# Patient Record
Sex: Female | Born: 2000 | Race: White | Hispanic: No | Smoking: Never smoker
Health system: Southern US, Community
[De-identification: ages and names within clinical notes are randomized; demographics above are authoritative.]

## PROBLEM LIST (undated history)

## (undated) DIAGNOSIS — F419 Anxiety disorder, unspecified: Secondary | ICD-10-CM

## (undated) DIAGNOSIS — E559 Vitamin D deficiency, unspecified: Secondary | ICD-10-CM

## (undated) DIAGNOSIS — F32A Depression, unspecified: Secondary | ICD-10-CM

## (undated) DIAGNOSIS — N390 Urinary tract infection, site not specified: Secondary | ICD-10-CM

## (undated) DIAGNOSIS — J302 Other seasonal allergic rhinitis: Secondary | ICD-10-CM

## (undated) HISTORY — DX: Urinary tract infection, site not specified: N39.0

## (undated) HISTORY — DX: Anxiety disorder, unspecified: F41.9

## (undated) HISTORY — DX: Vitamin D deficiency, unspecified: E55.9

## (undated) HISTORY — DX: Other seasonal allergic rhinitis: J30.2

## (undated) HISTORY — DX: Depression, unspecified: F32.A

## (undated) HISTORY — PX: NO PAST SURGERIES: SHX2092

---
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
## 2020-08-10 ENCOUNTER — Other Ambulatory Visit: Payer: Self-pay

## 2020-08-10 ENCOUNTER — Ambulatory Visit
Admission: EM | Admit: 2020-08-10 | Discharge: 2020-08-10 | Disposition: A | Discharge status: Home / Self Care | Payer: Self-pay

## 2020-08-10 NOTE — ED Notes (Signed)
Patient access, nicole, cma reports patient does not have insurance and wanted to be seen today, but billing processed tomorrow.  When told no, patient left

## 2020-08-11 ENCOUNTER — Encounter: Payer: Self-pay | Admitting: Internal Medicine

## 2020-08-11 ENCOUNTER — Ambulatory Visit: Payer: BC Managed Care – PPO | Admitting: Internal Medicine

## 2020-08-11 ENCOUNTER — Other Ambulatory Visit (HOSPITAL_COMMUNITY)
Admission: RE | Admit: 2020-08-11 | Discharge: 2020-08-11 | Disposition: A | Discharge status: Home / Self Care | Payer: BC Managed Care – PPO | Source: Ambulatory Visit | Attending: Internal Medicine | Admitting: Internal Medicine

## 2020-08-11 VITALS — BP 118/74 | HR 110 | Temp 98.2°F | Ht 64.0 in | Wt 118.0 lb

## 2020-08-11 DIAGNOSIS — E559 Vitamin D deficiency, unspecified: Secondary | ICD-10-CM

## 2020-08-11 DIAGNOSIS — N760 Acute vaginitis: Secondary | ICD-10-CM | POA: Diagnosis not present

## 2020-08-11 DIAGNOSIS — Z0000 Encounter for general adult medical examination without abnormal findings: Secondary | ICD-10-CM

## 2020-08-11 DIAGNOSIS — N3000 Acute cystitis without hematuria: Secondary | ICD-10-CM

## 2020-08-11 DIAGNOSIS — R198 Other specified symptoms and signs involving the digestive system and abdomen: Secondary | ICD-10-CM

## 2020-08-11 DIAGNOSIS — F419 Anxiety disorder, unspecified: Secondary | ICD-10-CM

## 2020-08-11 DIAGNOSIS — R102 Pelvic and perineal pain: Secondary | ICD-10-CM | POA: Diagnosis not present

## 2020-08-11 DIAGNOSIS — R140 Abdominal distension (gaseous): Secondary | ICD-10-CM

## 2020-08-11 DIAGNOSIS — R1084 Generalized abdominal pain: Secondary | ICD-10-CM

## 2020-08-11 LAB — CBC WITH DIFFERENTIAL/PLATELET
Basophils Absolute: 0 10*3/uL (ref 0.0–0.1)
Basophils Relative: 0.8 % (ref 0.0–3.0)
Eosinophils Absolute: 0.2 10*3/uL (ref 0.0–0.7)
Eosinophils Relative: 2.8 % (ref 0.0–5.0)
HCT: 40 % (ref 36.0–46.0)
Hemoglobin: 13.6 g/dL (ref 12.0–15.0)
Lymphocytes Relative: 16.7 % (ref 12.0–46.0)
Lymphs Abs: 1 10*3/uL (ref 0.7–4.0)
MCHC: 34.1 g/dL (ref 30.0–36.0)
MCV: 85.9 fl (ref 78.0–100.0)
Monocytes Absolute: 0.3 10*3/uL (ref 0.1–1.0)
Monocytes Relative: 5.1 % (ref 3.0–12.0)
Neutro Abs: 4.3 10*3/uL (ref 1.4–7.7)
Neutrophils Relative %: 74.6 % (ref 43.0–77.0)
Platelets: 233 10*3/uL (ref 150.0–400.0)
RBC: 4.65 Mil/uL (ref 3.87–5.11)
RDW: 13.3 % (ref 11.5–14.6)
WBC: 5.8 10*3/uL (ref 4.5–10.5)

## 2020-08-11 LAB — COMPREHENSIVE METABOLIC PANEL
ALT: 22 U/L (ref 0–35)
AST: 24 U/L (ref 0–37)
Albumin: 4.5 g/dL (ref 3.5–5.2)
Alkaline Phosphatase: 44 U/L (ref 39–117)
BUN: 14 mg/dL (ref 6–23)
CO2: 27 mEq/L (ref 19–32)
Calcium: 9.7 mg/dL (ref 8.4–10.5)
Chloride: 105 mEq/L (ref 96–112)
Creatinine, Ser: 0.93 mg/dL (ref 0.40–1.20)
GFR: 88.69 mL/min (ref >60.00)
Glucose, Bld: 85 mg/dL (ref 70–99)
Potassium: 4.3 mEq/L (ref 3.5–5.1)
Sodium: 141 mEq/L (ref 135–145)
Total Bilirubin: 0.7 mg/dL (ref 0.2–1.2)
Total Protein: 7.2 g/dL (ref 6.0–8.3)

## 2020-08-11 LAB — VITAMIN D 25 HYDROXY (VIT D DEFICIENCY, FRACTURES): VITD: 28.92 ng/mL — ABNORMAL LOW (ref 30.00–100.00)

## 2020-08-11 LAB — TSH: TSH: 1.6 u[IU]/mL (ref 0.35–5.50)

## 2020-08-11 MED ORDER — FLUCONAZOLE 150 MG PO TABS: 150.0000 mg | ORAL_TABLET | Freq: Once | ORAL | 0 refills | Status: DC

## 2020-08-11 NOTE — Patient Instructions (Addendum)
Consider seeing GI in the future -let us know  -[facility]  -[facility]    Call ob/gyn if you continue to have pelvic pain   [facility]  [place] [address] [phone]   [facility]  [address]  [place] [address]  [phone]  Nonspecific Chest Pain, Adult Chest pain can be caused by many different conditions. It can be caused by a condition that is life-threatening and requires treatment right away. It can also be caused by something that is not life-threatening. If you have chest pain, it can be hard to know the difference, so it is important to get help right away to make sure that you do not have a serious condition. Some life-threatening causes of chest pain include:  Heart attack.  A tear in the body's main blood vessel (aortic dissection).  Inflammation around your heart (pericarditis).  A problem in the lungs, such as a blood clot (pulmonary embolism) or a collapsed lung (pneumothorax). Some non life-threatening causes of chest pain include:  Heartburn.  Anxiety or stress.  Damage to the bones, muscles, and cartilage that make up your chest wall.  Pneumonia or bronchitis.  Shingles infection (varicella-zoster virus). Chest pain can feel like:  Pain or discomfort on the surface of your chest or deep in your chest.  Crushing, pressure, aching, or squeezing pain.  Burning or tingling.  Dull or sharp pain that is worse when you move, cough, or take a deep breath.  Pain or discomfort that is also felt in your back, neck, jaw, shoulder, or arm, or pain that spreads to any of these areas. Your chest pain may come and go. It may also be constant. Your health care provider will do lab tests and other studies to find the cause of your pain. Treatment will depend on the cause of your chest pain. Follow these  instructions at home: Medicines  Take over-the-counter and prescription medicines only as told by your health care provider.  If you were prescribed an antibiotic, take it as told by your health care provider. Do not stop taking the antibiotic even if you start to feel better. Lifestyle  Rest as directed by your health care provider.  Do not use any products that contain nicotine or tobacco, such as cigarettes and e-cigarettes. If you need help quitting, ask your health care provider.  Do not drink alcohol.  Make healthy lifestyle choices as recommended. These may include: ? Getting regular exercise. Ask your health care provider to suggest some activities that are safe for you. ? Eating a heart-healthy diet. This includes plenty of fresh fruits and vegetables, whole grains, low-fat (lean) protein, and low-fat dairy products. A dietitian can help you find healthy eating options. ? Maintaining a healthy weight. ? Managing any other health conditions you have, such as high blood pressure (hypertension) or diabetes. ? Reducing stress, such as with yoga or relaxation techniques.   General instructions  Pay attention to any changes in your symptoms. Tell your health care provider about them or any new symptoms.  Avoid any activities that cause chest pain.  Keep all follow-up visits as told by your health care provider. This is important. This includes visits for any further testing if your chest pain does not go away. Contact a health care provider if:  Your chest pain does not go away.  You feel depressed.  You have a fever. Get help right away if:  Your chest pain gets worse.  You have a cough that gets worse, or you cough up blood.  You have severe pain in your abdomen.  You faint.  You have sudden, unexplained chest discomfort.  You have sudden, unexplained discomfort in your arms, back, neck, or jaw.  You have shortness of breath at any time.  You suddenly start to  sweat, or your skin gets clammy.  You feel nausea or you vomit.  You suddenly feel lightheaded or dizzy.  You have severe weakness, or unexplained weakness or fatigue.  Your heart begins to beat quickly, or it feels like it is skipping beats. These symptoms may represent a serious problem that is an emergency. Do not wait to see if the symptoms will go away. Get medical help right away. Call your local emergency services (911 in the [place] Do not drive yourself to the hospital. Summary  Chest pain can be caused by a condition that is serious and requires urgent treatment. It may also be caused by something that is not life-threatening.  If you have chest pain, it is very important to see your health care provider. Your health care provider may do lab tests and other studies to find the cause of your pain.  Follow your health care provider's instructions on taking medicines, making lifestyle changes, and getting emergency treatment if symptoms become worse.  Keep all follow-up visits as told by your health care provider. This includes visits for any further testing if your chest pain does not go away. This information is not intended to replace advice given to you by your health care provider. Make sure you discuss any questions you have with your health care provider. Document Revised: 10/30/2017 Document Reviewed: 10/30/2017 Elsevier Patient Education  2021 [employer].  Fatigue If you have fatigue, you feel tired all the time and have a lack of energy or a lack of motivation. Fatigue may make it difficult to start or complete tasks because of exhaustion. In general, occasional or mild fatigue is often a normal response to activity or life. However, long-lasting (chronic) or extreme fatigue may be a symptom of a medical condition. Follow these instructions at home: General instructions  Watch your fatigue for any changes.  Go to bed and get up at the same time every day.  Avoid  fatigue by pacing yourself during the day and getting enough sleep at night.  Maintain a healthy weight. Medicines  Take over-the-counter and prescription medicines only as told by your health care provider.  Take a multivitamin, if told by your health care provider.  Do not use herbal or dietary supplements unless they are approved by your health care provider. Activity  Exercise regularly, as told by your health care provider.  Use or practice techniques to help you relax, such as yoga, tai chi, meditation, or massage therapy.   Eating and drinking  Avoid heavy meals in the evening.  Eat a well-balanced diet, which includes lean proteins, whole grains, plenty of fruits and vegetables, and low-fat dairy products.  Avoid consuming too much caffeine.  Avoid the use of alcohol.  Drink enough fluid to keep your urine pale yellow.   Lifestyle  Change situations that cause you stress. Try to keep your work and personal schedule in balance.  Do not use any products that contain nicotine or tobacco, such as cigarettes and e-cigarettes. If you need help quitting, ask your health care provider.  Do not use drugs. Contact a health care provider if:  Your fatigue does not get better.  You have a fever.  You suddenly lose or gain weight.  You have headaches.  You have trouble falling asleep or sleeping through the night.  You feel angry, guilty, anxious, or sad.  You are unable to have a bowel movement (constipation).  Your skin is dry.  You have swelling in your legs or another part of your body. Get help right away if:  You feel confused.  Your vision is blurry.  You feel faint or you pass out.  You have a severe headache.  You have severe pain in your abdomen, your back, or the area between your waist and hips (pelvis).  You have chest pain, shortness of breath, or an irregular or fast heartbeat.  You are unable to urinate, or you urinate less than  normal.  You have abnormal bleeding, such as bleeding from the rectum, vagina, nose, lungs, or nipples.  You vomit blood.  You have thoughts about hurting yourself or others. If you ever feel like you may hurt yourself or others, or have thoughts about taking your own life, get help right away. You can go to your nearest emergency department or call:  Your local emergency services (911 in the [place]  A suicide crisis helpline, such as the [facility] at 628-433-0298. This is open 24 hours a day. Summary  If you have fatigue, you feel tired all the time and have a lack of energy or a lack of motivation.  Fatigue may make it difficult to start or complete tasks because of exhaustion.  Long-lasting (chronic) or extreme fatigue may be a symptom of a medical condition.  Exercise regularly, as told by your health care provider.  Change situations that cause you stress. Try to keep your work and personal schedule in balance. This information is not intended to replace advice given to you by your health care provider. Make sure you discuss any questions you have with your health care provider. Document Revised: 11/18/2018 Document Reviewed: 01/22/2017 Elsevier Patient Education  2021 [place].  Vaginal Yeast Infection, Adult  Vaginal yeast infection is a condition that causes vaginal discharge as well as soreness, swelling, and redness (inflammation) of the vagina. This is a common condition. Some women get this infection frequently. What are the causes? This condition is caused by a change in the normal balance of the yeast (candida) and bacteria that live in the vagina. This change causes an overgrowth of yeast, which causes the inflammation. What increases the risk? The condition is more likely to develop in women who:  Take antibiotic medicines.  Have diabetes.  Take birth control pills.  Are pregnant.  Douche often.  Have a weak body defense  system (immune system).  Have been taking steroid medicines for a long time.  Frequently wear tight clothing. What are the signs or symptoms? Symptoms of this condition include:  White, thick, creamy vaginal discharge.  Swelling, itching, redness, and irritation of the vagina. The lips of the vagina (vulva) may be affected as well.  Pain or a burning feeling while urinating.  Pain during sex. How is this diagnosed? This condition is diagnosed based on:  Your medical history.  A physical exam.  A pelvic exam. Your health care provider will examine a sample of your vaginal discharge under a microscope. Your health care provider may send this sample for testing to confirm the diagnosis. How is  this treated? This condition is treated with medicine. Medicines may be over-the-counter or prescription. You may be told to use one or more of the following:  Medicine that is taken by mouth (orally).  Medicine that is applied as a cream (topically).  Medicine that is inserted directly into the vagina (suppository). Follow these instructions at home: Lifestyle  Do not have sex until your health care provider approves. Tell your sex partner that you have a yeast infection. That person should go to his or her health care provider and ask if they should also be treated.  Do not wear tight clothes, such as pantyhose or tight pants.  Wear breathable cotton underwear. General instructions  Take or apply over-the-counter and prescription medicines only as told by your health care provider.  Eat more yogurt. This may help to keep your yeast infection from returning.  Do not use tampons until your health care provider approves.  Try taking a sitz bath to help with discomfort. This is a warm water bath that is taken while you are sitting down. The water should only come up to your hips and should cover your buttocks. Do this 3-4 times per day or as told by your health care provider.  Do not  douche.  If you have diabetes, keep your blood sugar levels under control.  Keep all follow-up visits as told by your health care provider. This is important.   Contact a health care provider if:  You have a fever.  Your symptoms go away and then return.  Your symptoms do not get better with treatment.  Your symptoms get worse.  You have new symptoms.  You develop blisters in or around your vagina.  You have blood coming from your vagina and it is not your menstrual period.  You develop pain in your abdomen. Summary  Vaginal yeast infection is a condition that causes discharge as well as soreness, swelling, and redness (inflammation) of the vagina.  This condition is treated with medicine. Medicines may be over-the-counter or prescription.  Take or apply over-the-counter and prescription medicines only as told by your health care provider.  Do not douche. Do not have sex or use tampons until your health care provider approves.  Contact a health care provider if your symptoms do not get better with treatment or your symptoms go away and then return. This information is not intended to replace advice given to you by your health care provider. Make sure you discuss any questions you have with your health care provider. Document Revised: 11/27/2018 Document Reviewed: 09/15/2017 Elsevier Patient Education  2021 [place].  Bacterial Vaginosis  Bacterial vaginosis is an infection that occurs when the normal balance of bacteria in the vagina changes. This change is caused by an overgrowth of certain bacteria in the vagina. Bacterial vaginosis is the most common vaginal infection among females aged 90 to 33 years. This condition increases the risk of sexually transmitted infections (STIs). Treatment can help reduce this risk. Treatment is very important for pregnant women because this condition can cause babies to be born early (prematurely) or at a low birth weight. What are the  causes? This condition is caused by an increase in harmful bacteria that are normally present in small amounts in the vagina. However, the exact reason this condition develops is not known. You cannot get bacterial vaginosis from toilet seats, bedding, swimming pools, or contact with objects around you. What increases the risk? The following factors may make you more likely  to develop this condition:  Having a new sexual partner or multiple sexual partners, or having unprotected sex.  Douching.  Having an intrauterine device (IUD).  Smoking.  Abusing drugs and alcohol. This may lead to riskier sexual behavior.  Taking certain antibiotic medicines.  Being pregnant. What are the signs or symptoms? Some women with this condition have no symptoms. Symptoms may include:  Remmers or white vaginal discharge. The discharge can be watery or foamy.  A fish-like odor with discharge, especially after sex or during menstruation.  Itching in and around the vagina.  Burning or pain with urination. How is this diagnosed? This condition is diagnosed based on:  Your medical history.  A physical exam of the vagina.  Checking a sample of vaginal fluid for harmful bacteria or abnormal cells. How is this treated? This condition is treated with antibiotic medicines. These may be given as a pill, a vaginal cream, or a medicine that is put into the vagina (suppository). If the condition comes back after treatment, a second round of antibiotics may be needed. Follow these instructions at home: Medicines  Take or apply over-the-counter and prescription medicines only as told by your health care provider.  Take or apply your antibiotic medicine as told by your health care provider. Do not stop using the antibiotic even if you start to feel better. General instructions  If you have a female sexual partner, tell her that you have a vaginal infection. She should follow up with her health care provider.  If you have a female sexual partner, he does not need treatment.  Avoid sexual activity until you finish treatment.  Drink enough fluid to keep your urine pale yellow.  Keep the area around your vagina and rectum clean. ? Wash the area daily with warm water. ? Wipe yourself from front to back after using the toilet.  If you are breastfeeding, talk to your health care provider about continuing breastfeeding during treatment.  Keep all follow-up visits. This is important. How is this prevented? Self-care  Do not douche.  Wash the outside of your vagina with warm water only.  Wear cotton or cotton-lined underwear.  Avoid wearing tight pants and pantyhose, especially during the summer. Safe sex  Use protection when having sex. This includes: ? Using condoms. ? Using dental dams. This is a thin layer of a material made of latex or polyurethane that protects the mouth during oral sex.  Limit the number of sexual partners. To help prevent bacterial vaginosis, it is best to have sex with just one partner (monogamous relationship).  Make sure you and your sexual partner are tested for STIs. Drugs and alcohol  Do not use any products that contain nicotine or tobacco. These products include cigarettes, chewing tobacco, and vaping devices, such as e-cigarettes. If you need help quitting, ask your health care provider.  Do not use drugs.  Do not drink alcohol if: ? Your health care provider tells you not to do this. ? You are pregnant, may be pregnant, or are planning to become pregnant.  If you drink alcohol: ? Limit how much you have to 0-1 drink a day. ? Be aware of how much alcohol is in your drink. In the [place] one drink equals one 12 oz bottle of beer (355 mL), one 5 oz glass of wine (148 mL), or one 1 oz glass of hard liquor (44 mL). Where to find more information  Centers for Disease Control and Prevention: http://www.wolf.info/  American Sexual Health  Association (ASHA):  www.ashastd.org  U.S. Department of Health and Financial controller, Office on Women's Health: [place].tn Contact a health care provider if:  Your symptoms do not improve, even after treatment.  You have more discharge or pain when urinating.  You have a fever or chills.  You have pain in your abdomen or pelvis.  You have pain during sex.  You have vaginal bleeding between menstrual periods. Summary  Bacterial vaginosis is a vaginal infection that occurs when the normal balance of bacteria in the vagina changes. It results from an overgrowth of certain bacteria.  This condition increases the risk of sexually transmitted infections (STIs). Getting treated can help reduce this risk.  Treatment is very important for pregnant women because this condition can cause babies to be born early (prematurely) or at low birth weight.  This condition is treated with antibiotic medicines. These may be given as a pill, a vaginal cream, or a medicine that is put into the vagina (suppository). This information is not intended to replace advice given to you by your health care provider. Make sure you discuss any questions you have with your health care provider. Document Revised: 10/28/2019 Document Reviewed: 10/28/2019 Elsevier Patient Education  [place].

## 2020-08-11 NOTE — Progress Notes (Addendum)
Chief Complaint  Patient presents with  . Urinary Tract Infection  . Vaginitis   F/u  1. C/o lower ab pain and discomfort since Monday 5/10 stomach pain but h/o GI issues in the past Sunday or Monday used a different soap from dove coconut to cucumber and then sx's of itching started and when placed OCP patch Sunday had abdominal pain and gym Monday with tight underwear and sweating. She had rectal itching then in vaginal tried vagisil Monday  She is sexually active   Review of Systems  Constitutional: Negative for weight loss.  HENT: Negative for hearing loss.   Eyes: Negative for blurred vision.  Respiratory: Negative for shortness of breath.   Cardiovascular: Negative for chest pain.  Gastrointestinal: Positive for abdominal pain.  Genitourinary: Negative for dysuria.       +female pelvic pain   Skin: Negative for rash.  Psychiatric/Behavioral: The patient is nervous/anxious.    Past Medical History:  Diagnosis Date  . Seasonal allergies    No past surgical history on file. Family History  Problem Relation Age of Onset  . Hypertension Father   . Breast cancer Neg Hx   . Colon cancer Neg Hx    Social History   Socioeconomic History  . Marital status: Single    Spouse name: Not on file  . Number of children: Not on file  . Years of education: Not on file  . Highest education level: Not on file  Occupational History  . Not on file  Tobacco Use  . Smoking status: Never Smoker  . Smokeless tobacco: Never Used  Vaping Use  . Vaping Use: Never used  Substance and Sexual Activity  . Alcohol use: No  . Drug use: No  . Sexual activity: Not Currently    Birth control/protection: None  Other Topics Concern  . Not on file  Social History Narrative   At [employer]- senior.    AD in science   Works 2 nights and [employer]    Social Determinants of Health   Financial Resource Strain: Not on file  Food Insecurity: Not on file  Transportation Needs: Not on file  Physical  Activity: Not on file  Stress: Not on file  Social Connections: Not on file  Intimate Partner Violence: Not on file   Current Meds  Medication Sig  . fexofenadine (ALLEGRA) 180 MG tablet Take 180 mg by mouth daily.  . fluconazole (DIFLUCAN) 150 MG tablet Take 1 tablet (150 mg total) by mouth once for 1 dose. Repeat in 3 days with food  . norelgestromin-ethinyl estradiol (ORTHO EVRA) 150-35 MCG/24HR transdermal patch Place 1 patch onto the skin once a week.  Marland Kitchen VITAMIN D PO Take by mouth.   No Known Allergies No results found for this or any previous visit (from the past 2160 hour(s)). Objective  Body mass index is 20.25 kg/m. Wt Readings from Last 3 Encounters:  08/11/20 118 lb (53.5 kg)  08/18/19 118 lb (53.5 kg) (32 %, Z= -0.47)*  06/15/18 113 lb (51.3 kg) (27 %, Z= -0.63)*   * Growth percentiles are based on CDC (Girls, 2-20 Years) data.   Temp Readings from Last 3 Encounters:  08/11/20 98.2 F (36.8 C) (Oral)  04/17/18 98.1 F (36.7 C)  02/23/18 98.8 F (37.1 C) (Oral)   BP Readings from Last 3 Encounters:  08/11/20 118/74  08/18/19 105/68  06/15/18 108/60   Pulse Readings from Last 3 Encounters:  08/11/20 (!) 110  08/18/19 79  04/17/18 73  Physical Exam Vitals and nursing note reviewed. Exam conducted with a chaperone present.  Constitutional:      Appearance: Normal appearance. She is well-developed and well-groomed.  HENT:     Head: Normocephalic and atraumatic.  Eyes:     Conjunctiva/sclera: Conjunctivae normal.     Pupils: Pupils are equal, round, and reactive to light.  Cardiovascular:     Rate and Rhythm: Normal rate and regular rhythm.     Heart sounds: Normal heart sounds. No murmur heard.   Pulmonary:     Effort: Pulmonary effort is normal.     Breath sounds: Normal breath sounds.  Abdominal:     General: Abdomen is flat. Bowel sounds are normal.     Tenderness: There is abdominal tenderness in the right lower quadrant and left lower  quadrant. There is no right CVA tenderness or left CVA tenderness.  Genitourinary:    Pubic Area: Rash present.     Labia:        Right: No rash.        Left: No rash.      Vagina: Normal.     Cervix: Discharge present.     Uterus: Normal.      Adnexa: Right adnexa normal and left adnexa normal.     Comments: Rectal irritation   Skin:    General: Skin is warm and moist.  Neurological:     General: No focal deficit present.     Mental Status: She is alert and oriented to person, place, and time. Mental status is at baseline.     Gait: Gait normal.  Psychiatric:        Attention and Perception: Attention and perception normal.        Mood and Affect: Mood and affect normal.        Speech: Speech normal.        Behavior: Behavior normal. Behavior is cooperative.        Thought Content: Thought content normal.        Cognition and Memory: Cognition and memory normal.        Judgment: Judgment normal.     Assessment  Plan  Acute vaginitis/female pelvic pain - Plan: Cervicovaginal ancillary only( [place]), fluconazole (DIFLUCAN) 150 MG tablet F/u ob/gyn if continues to have ab pain   Acute cystitis without hematuria - Plan: Urinalysis, Routine w reflex microscopic, Urine Culture  Annual physical exam - Plan: Comprehensive metabolic panel, CBC with Differential/Platelet, TSH, Vitamin D (25 hydroxy) Vitamin D deficiency - Plan: Vitamin D (25 hydroxy)   Ab bloating  Let us know about GI referral  Anxiety given list of therapist  And meditation apps  Provider: Dr. French Ana [facility]

## 2020-08-12 LAB — URINALYSIS, ROUTINE W REFLEX MICROSCOPIC
Bilirubin, UA: NEGATIVE
Glucose, UA: NEGATIVE
Leukocytes,UA: NEGATIVE
Nitrite, UA: NEGATIVE
Protein,UA: NEGATIVE
RBC, UA: NEGATIVE
Specific Gravity, UA: 1.027 (ref 1.005–1.030)
Urobilinogen, Ur: 1 mg/dL (ref 0.2–1.0)
pH, UA: 5.5 (ref 5.0–7.5)

## 2020-08-13 LAB — CERVICOVAGINAL ANCILLARY ONLY
Bacterial Vaginitis (gardnerella): NEGATIVE
Candida Glabrata: NEGATIVE
Candida Vaginitis: NEGATIVE
Chlamydia: NEGATIVE
Comment: NEGATIVE
Comment: NEGATIVE
Comment: NEGATIVE
Comment: NEGATIVE
Comment: NEGATIVE
Comment: NORMAL
Neisseria Gonorrhea: NEGATIVE
Trichomonas: NEGATIVE

## 2020-08-13 LAB — URINE CULTURE: Organism ID, Bacteria: NO GROWTH

## 2020-08-16 ENCOUNTER — Other Ambulatory Visit: Payer: Self-pay

## 2020-08-16 ENCOUNTER — Ambulatory Visit
Admission: EM | Admit: 2020-08-16 | Discharge: 2020-08-16 | Disposition: A | Discharge status: Home / Self Care | Payer: BC Managed Care – PPO | Attending: Family Medicine | Admitting: Family Medicine

## 2020-08-16 ENCOUNTER — Encounter: Payer: Self-pay | Admitting: Emergency Medicine

## 2020-08-16 DIAGNOSIS — N3000 Acute cystitis without hematuria: Secondary | ICD-10-CM | POA: Insufficient documentation

## 2020-08-16 DIAGNOSIS — R300 Dysuria: Secondary | ICD-10-CM

## 2020-08-16 LAB — POCT URINALYSIS DIP (MANUAL ENTRY)
Bilirubin, UA: NEGATIVE
Glucose, UA: NEGATIVE mg/dL
Nitrite, UA: POSITIVE — AB
Protein Ur, POC: 100 mg/dL — AB
Spec Grav, UA: 1.025 (ref 1.010–1.025)
Urobilinogen, UA: 1 E.U./dL
pH, UA: 6.5 (ref 5.0–8.0)

## 2020-08-16 MED ORDER — NITROFURANTOIN MONOHYD MACRO 100 MG PO CAPS: 100.0000 mg | ORAL_CAPSULE | Freq: Two times a day (BID) | ORAL | 0 refills | Status: AC

## 2020-08-16 NOTE — ED Triage Notes (Signed)
Patient c/o hematuria and urinary frequency that started this morning.   Patient endorses "cloudy, clumpy urine".   Patient endorses dysuria and ABD pain, "it feels like I'm being stabbed".   Patient denies N/V/D.   Patient denies any abnormal vaginal discharge.   Patient was prescribed Diflucan on Friday and has taken one pill.   Patient hasn't taken any medications for symptoms.

## 2020-08-16 NOTE — ED Provider Notes (Signed)
Cassandra Huff    CSN: [id number] Arrival date & time: 08/16/20  0913      History   Chief Complaint Chief Complaint  Patient presents with  . Hematuria  . Urinary Frequency    HPI Cassandra Huff is a 20 y.o. female.   HPI  Cassandra Huff is a 20 y.o. female presents for evaluation of urinary frequency, urgency and dysuria x 1 days, without flank pain, fever, chills, or abnormal vaginal discharge or bleeding. Recently treated for vaginitis. Patient's last menstrual period was 08/01/2020 (within days).   Past Medical History:  Diagnosis Date  . Seasonal allergies     Patient Active Problem List   Diagnosis Date Noted  . Vitamin D deficiency 08/11/2020  . Other fatigue 04/19/2018  . Pelvic pain 04/19/2018  . Bloating 04/19/2018  . Concentration deficit 02/25/2018  . Chronic pain of both feet 02/25/2018  . GAD (generalized anxiety disorder) 02/23/2018    History reviewed. No pertinent surgical history.  OB History    Gravida  0   Para  0   Term  0   Preterm  0   AB  0   Living  0     SAB  0   IAB  0   Ectopic  0   Multiple  0   Live Births  0            Home Medications    Prior to Admission medications   Medication Sig Start Date End Date Taking? Authorizing Provider  fexofenadine (ALLEGRA) 180 MG tablet Take 180 mg by mouth daily.   Yes [provider]  nitrofurantoin, macrocrystal-monohydrate, (MACROBID) 100 MG capsule Take 1 capsule (100 mg total) by mouth 2 (two) times daily for 7 days. 08/16/20 08/23/20 Yes Bing Neighbors, FNP  norelgestromin-ethinyl estradiol (ORTHO EVRA) 150-35 MCG/24HR transdermal patch Place 1 patch onto the skin once a week. 08/18/19  Yes Tresea Mall, CNM  VITAMIN D PO Take by mouth.   Yes [provider]    Family History Family History  Problem Relation Age of Onset  . Hypertension Father   . Breast cancer Neg Hx   . Colon cancer Neg Hx     Social History Social History    Tobacco Use  . Smoking status: Never Smoker  . Smokeless tobacco: Never Used  Vaping Use  . Vaping Use: Never used  Substance Use Topics  . Alcohol use: No  . Drug use: No     Allergies   Patient has no known allergies.   Review of Systems Review of Systems Pertinent negatives listed in HPI   Physical Exam Triage Vital Signs ED Triage Vitals  Enc Vitals Group     BP 08/16/20 0937 123/74     Pulse Rate 08/16/20 0937 (!) 109     Resp 08/16/20 0937 15     Temp 08/16/20 0937 98.6 F (37 C)     Temp Source 08/16/20 0937 Oral     SpO2 08/16/20 0937 100 %     Weight --      Height --      Head Circumference --      Peak Flow --      Pain Score 08/16/20 0935 5     Pain Loc --      Pain Edu? --      Excl. in GC? --    No data found.  Updated Vital Signs BP 123/74 (BP Location: Left Arm)  Pulse (!) 109   Temp 98.6 F (37 C) (Oral)   Resp 15   LMP 08/01/2020 (Within Days)   SpO2 100%   Visual Acuity Right Eye Distance:   Left Eye Distance:   Bilateral Distance:    Right Eye Near:   Left Eye Near:    Bilateral Near:     Physical Exam General appearance: alert, well developed, well nourished, cooperative  Head: Normocephalic, without obvious abnormality, atraumatic Respiratory: Respirations even and unlabored, normal respiratory rate Heart: rate and rhythm normal. No gallop or murmurs noted on exam  Abdomen: BS +, no distention, no rebound tenderness, or no mass Extremities: No gross deformities Skin: Skin color, texture, turgor normal. No rashes seen  Psych: Appropriate mood and affect.  UC Treatments / Results  Labs (all labs ordered are listed, but only abnormal results are displayed) Labs Reviewed  POCT URINALYSIS DIP (MANUAL ENTRY) - Abnormal; Notable for the following components:      Result Value   Clarity, UA cloudy (*)    Ketones, POC UA trace (5) (*)    Blood, UA large (*)    Protein Ur, POC =100 (*)    Nitrite, UA Positive (*)     Leukocytes, UA Small (1+) (*)    All other components within normal limits  URINE CULTURE    EKG   Radiology No results found.  Procedures Procedures (including critical care time)  Medications Ordered in UC Medications - No data to display  Initial Impression / Assessment and Plan / UC Course  I have reviewed the triage vital signs and the nursing notes.  Pertinent labs & imaging results that were available during my care of the patient were reviewed by me and considered in my medical decision making (see chart for details).      UA abnormal and findings consistent with UTI. Empiric antibiotic treatment initiated per discharge medications orders. Encouraged increase intake of water. ER if symptoms become severe. Follow-up with PCP if symptoms do not completely resolve.  Final Clinical Impressions(s) / UC Diagnoses   Final diagnoses:  Acute cystitis without hematuria   Discharge Instructions   None    ED Prescriptions    Medication Sig Dispense Auth. Provider   nitrofurantoin, macrocrystal-monohydrate, (MACROBID) 100 MG capsule Take 1 capsule (100 mg total) by mouth 2 (two) times daily for 7 days. 14 capsule Bing Neighbors, FNP     PDMP not reviewed this encounter.   Bing Neighbors, [place] 08/16/20 [phone]

## 2020-08-18 LAB — URINE CULTURE: Culture: >=100000 — AB

## 2020-08-28 ENCOUNTER — Encounter: Payer: Self-pay | Admitting: Internal Medicine

## 2020-08-28 NOTE — Addendum Note (Signed)
Addended by: Quentin Ore on: 08/28/2020 09:55 PM   Modules accepted: Orders

## 2020-08-30 ENCOUNTER — Ambulatory Visit: Payer: BC Managed Care – PPO | Admitting: Family

## 2020-08-30 ENCOUNTER — Encounter: Payer: Self-pay | Admitting: Family

## 2020-08-30 ENCOUNTER — Other Ambulatory Visit: Payer: Self-pay

## 2020-08-30 VITALS — BP 98/52 | Temp 97.9°F | Ht 64.0 in | Wt 122.0 lb

## 2020-08-30 DIAGNOSIS — R300 Dysuria: Secondary | ICD-10-CM | POA: Diagnosis not present

## 2020-08-30 DIAGNOSIS — R198 Other specified symptoms and signs involving the digestive system and abdomen: Secondary | ICD-10-CM | POA: Insufficient documentation

## 2020-08-30 DIAGNOSIS — F419 Anxiety disorder, unspecified: Secondary | ICD-10-CM | POA: Diagnosis not present

## 2020-08-30 DIAGNOSIS — R319 Hematuria, unspecified: Secondary | ICD-10-CM | POA: Diagnosis not present

## 2020-08-30 DIAGNOSIS — F32A Depression, unspecified: Secondary | ICD-10-CM

## 2020-08-30 LAB — POCT URINALYSIS DIPSTICK
Bilirubin, UA: NEGATIVE
Glucose, UA: NEGATIVE
Ketones, UA: NEGATIVE
Leukocytes, UA: NEGATIVE
Nitrite, UA: NEGATIVE
Protein, UA: NEGATIVE
Spec Grav, UA: 1.015 (ref 1.010–1.025)
Urobilinogen, UA: 0.2 E.U./dL
pH, UA: 6.5 (ref 5.0–8.0)

## 2020-08-30 MED ORDER — CEPHALEXIN 500 MG PO CAPS: 500.0000 mg | ORAL_CAPSULE | Freq: Four times a day (QID) | ORAL | 0 refills | Status: DC

## 2020-08-30 NOTE — Progress Notes (Signed)
Subjective:    Patient ID: Cassandra Huff, female    DOB: 06-10-2000, 20 y.o.   MRN: [id number]  CC: Cassandra Huff is a 20 y.o. female who presents today for follow up.   HPI: Complains of dysuria and hematuria x 3 days ago, resolved after a couple of hours. She is not on anticoagulation.  No fever, chills, flank pain.   Started menses this morning. She endorses bilateral suprapubic cramping which is typical for her with menses.  No h/o renal stone. No rectal bleeding.   Vaginal itching has resolved.  No STDs. Sexual active. One partner and no concerns for pregnancy  Complains of fluctuating diarrhea and constipation for years, unchanged. Related to stress. No weight loss. Appetite remains good.  She endorses abdominal bloating. She would like referral to GI.   Works in [employer], [employer] with new job next month.  Healthy relationship with partner. She has h/o depression and anxiety. Worse in winter months and is improving in warmer weather. Tried counseling in the past and didn't find helpful.  No si/ hi. No h/o hospitalization for depression or anxiety.  No formal excericse however job is very physical.      Seen 4/[facility] at [facility] for hematuria. Started on macrobid. UC showed e coli < 16 sensitive to macrobid.    HISTORY:  Past Medical History:  Diagnosis Date  . Seasonal allergies   . UTI (urinary tract infection)    E coli 08/16/20 tx'ed macrobid  . Vitamin D deficiency    History reviewed. No pertinent surgical history. Family History  Problem Relation Age of Onset  . Hypertension Father   . Breast cancer Neg Hx   . Colon cancer Neg Hx     Allergies: Patient has no known allergies. Current Outpatient Medications on File Prior to Visit  Medication Sig Dispense Refill  . fexofenadine (ALLEGRA) 180 MG tablet Take 180 mg by mouth daily.    . norelgestromin-ethinyl estradiol (ORTHO EVRA) 150-35 MCG/24HR transdermal patch Place 1 patch onto the skin once a week. 3 patch 12  .  VITAMIN D PO Take by mouth.     No current facility-administered medications on file prior to visit.    Social History   Tobacco Use  . Smoking status: Never Smoker  . Smokeless tobacco: Never Used  Vaping Use  . Vaping Use: Never used  Substance Use Topics  . Alcohol use: No  . Drug use: No    Review of Systems  Constitutional: Negative for chills and fever.  Respiratory: Negative for cough.   Cardiovascular: Negative for chest pain and palpitations.  Gastrointestinal: Positive for abdominal distention, constipation and diarrhea. Negative for abdominal pain, blood in stool, nausea and vomiting.  Genitourinary: Positive for dysuria, hematuria, menstrual problem, pelvic pain and vaginal bleeding.  Psychiatric/Behavioral: Negative for sleep disturbance and suicidal ideas. The patient is nervous/anxious.       Objective:    BP (!) 98/52   Temp 97.9 F (36.6 C)   Ht 5\' 4"  (1.626 m)   Wt 122 lb (55.3 kg)   LMP 08/01/2020 (Within Days)   SpO2 99%   BMI 20.94 kg/m  BP Readings from Last 3 Encounters:  08/30/20 (!) 98/52  08/16/20 123/74  08/11/20 118/74   Wt Readings from Last 3 Encounters:  08/30/20 122 lb (55.3 kg)  08/11/20 118 lb (53.5 kg)  08/18/19 118 lb (53.5 kg) (32 %, Z= -0.47)*   * Growth percentiles are based on CDC (Girls, 2-20 Years) data.  Physical Exam Vitals reviewed.  Constitutional:      Appearance: Normal appearance. She is well-developed.  Eyes:     Conjunctiva/sclera: Conjunctivae normal.  Cardiovascular:     Rate and Rhythm: Normal rate and regular rhythm.     Pulses: Normal pulses.     Heart sounds: Normal heart sounds.  Pulmonary:     Effort: Pulmonary effort is normal.     Breath sounds: Normal breath sounds. No wheezing, rhonchi or rales.  Abdominal:     General: Bowel sounds are normal. There is no distension.     Palpations: Abdomen is soft. Abdomen is not rigid. There is no fluid wave or mass.     Tenderness: There is no  abdominal tenderness. There is no guarding or rebound.  Skin:    General: Skin is warm and dry.  Neurological:     Mental Status: She is alert.  Psychiatric:        Speech: Speech normal.        Behavior: Behavior normal.        Thought Content: Thought content normal.        Assessment & Plan:   Problem List Items Addressed This Visit      Other   Alternating constipation and diarrhea    Chronic. No weight loss, rectal bleeding. Benign exam. Pending [place]. Patient prefers to follow up with GYN regarding pelvic cramping. She declines pelvic US and states this could be done in [facility] GYN office as done in the past.  Referral to GI.      Relevant Orders   US Abdomen Complete   Ambulatory referral to Gastroenterology   Anxiety and depression    suboptimal control. She declines trial of medication or counseling at this time. Discussed formal exercise as way to help with depression, anxiety especially now that weather is warmer. Advised trial of the Calm App for meditation, deep breathing exercises. Will follow.       Hematuria    Trace blood. Start keflex as suspect macrobid least effective based on e coli seen in urine culture. Pending urine culture.       Relevant Orders   Urinalysis, Routine w reflex microscopic    Other Visit Diagnoses    Dysuria    -  Primary   Relevant Medications   cephALEXin (KEFLEX) 500 MG capsule   Other Relevant Orders   Urine Culture   Urinalysis, Routine w reflex microscopic   POCT Urinalysis Dipstick (Completed)       I am having Cassandra Huff start on cephALEXin. I am also having her maintain her fexofenadine, norelgestromin-ethinyl estradiol, and VITAMIN D PO.   Meds ordered this encounter  Medications  . cephALEXin (KEFLEX) 500 MG capsule    Sig: Take 1 capsule (500 mg total) by mouth every 6 (six) hours.    Dispense:  28 capsule    Refill:  0    Order Specific Question:   Supervising Provider    Answer:   Sherlene Shams [[id number]]     Return precautions given.   Risks, benefits, and alternatives of the medications and treatment plan prescribed today were discussed, and patient expressed understanding.   Education regarding symptom management and diagnosis given to patient on AVS.  Continue to follow with [name], FNP for routine health maintenance.   Cassandra Huff and I agreed with plan.   Rennie Plowman, FNP

## 2020-08-30 NOTE — Assessment & Plan Note (Signed)
Trace blood. Start keflex as suspect macrobid least effective based on e coli seen in urine culture. Pending urine culture.

## 2020-08-30 NOTE — Patient Instructions (Addendum)
Start keflex ( antibiotic) No alcohol on antibiotic and use back up contraception as antibiotic may make birth control less effective.   Ensure to take probiotics while on antibiotics and also for 2 weeks after completion. This can either be by eating yogurt daily or taking a probiotic supplement over the counter such as Culturelle.It is important to re-colonize the gut with good bacteria and also to prevent any diarrheal infections associated with antibiotic use.   As discussed, please ensure you call Tresea Mall at [facility] in regards to annual exam and cramping with menses.   Please let me know if you change your mind and would like me to order vaginal ultrasound  Start keflex ahead of urine culture  Return in 6 weeks to ensure blood has resolved in urine.   I have also ordered ultrasound of abdomen Let us know if you dont hear back within a week in regards to an appointment being scheduled.

## 2020-08-30 NOTE — Assessment & Plan Note (Signed)
suboptimal control. She declines trial of medication or counseling at this time. Discussed formal exercise as way to help with depression, anxiety especially now that weather is warmer. Advised trial of the Calm App for meditation, deep breathing exercises. Will follow.

## 2020-08-30 NOTE — Assessment & Plan Note (Signed)
Chronic. No weight loss, rectal bleeding. Benign exam. Pending [place]. Patient prefers to follow up with GYN regarding pelvic cramping. She declines pelvic US and states this could be done in [facility] GYN office as done in the past.  Referral to GI.

## 2020-08-31 ENCOUNTER — Telehealth: Payer: Self-pay | Admitting: Family

## 2020-08-31 ENCOUNTER — Encounter: Payer: Self-pay | Admitting: *Deleted

## 2020-08-31 LAB — URINALYSIS, ROUTINE W REFLEX MICROSCOPIC
Bilirubin Urine: NEGATIVE
Ketones, ur: NEGATIVE
Leukocytes,Ua: NEGATIVE
Nitrite: NEGATIVE
Specific Gravity, Urine: 1.01 (ref 1.000–1.030)
Total Protein, Urine: NEGATIVE
Urine Glucose: NEGATIVE
Urobilinogen, UA: 0.2 (ref 0.0–1.0)
pH: 6.5 (ref 5.0–8.0)

## 2020-08-31 LAB — URINE CULTURE
MICRO NUMBER:: 11792590
SPECIMEN QUALITY:: ADEQUATE

## 2020-08-31 NOTE — Telephone Encounter (Signed)
lft vm for pt to call ofc tosch US ab. thanks 

## 2020-09-03 ENCOUNTER — Other Ambulatory Visit: Payer: Self-pay | Admitting: Advanced Practice Midwife

## 2020-09-03 DIAGNOSIS — Z30016 Encounter for initial prescription of transdermal patch hormonal contraceptive device: Secondary | ICD-10-CM

## 2020-09-03 DIAGNOSIS — Z0000 Encounter for general adult medical examination without abnormal findings: Secondary | ICD-10-CM

## 2020-09-04 ENCOUNTER — Encounter: Payer: Self-pay | Admitting: *Deleted

## 2020-09-04 NOTE — Addendum Note (Signed)
Addended by: Quentin Ore on: 09/04/2020 12:26 PM   Modules accepted: Orders

## 2020-09-11 ENCOUNTER — Other Ambulatory Visit: Payer: Self-pay | Admitting: Advanced Practice Midwife

## 2020-09-11 DIAGNOSIS — Z30016 Encounter for initial prescription of transdermal patch hormonal contraceptive device: Secondary | ICD-10-CM

## 2020-09-11 DIAGNOSIS — Z0000 Encounter for general adult medical examination without abnormal findings: Secondary | ICD-10-CM

## 2020-09-13 ENCOUNTER — Other Ambulatory Visit: Payer: Self-pay

## 2020-09-13 DIAGNOSIS — Z30016 Encounter for initial prescription of transdermal patch hormonal contraceptive device: Secondary | ICD-10-CM

## 2020-09-13 DIAGNOSIS — Z0000 Encounter for general adult medical examination without abnormal findings: Secondary | ICD-10-CM

## 2020-09-13 MED ORDER — NORELGESTROMIN-ETH ESTRADIOL 150-35 MCG/24HR TD PTWK: 1.0000 | MEDICATED_PATCH | TRANSDERMAL | 0 refills | Status: DC

## 2020-09-13 NOTE — Telephone Encounter (Signed)
Pt calling; needs bc refill; appt scheduled in 2wks.  [phone]  Left detailed msg ref eRx'd.

## 2020-09-25 ENCOUNTER — Ambulatory Visit: Payer: Self-pay | Admitting: Advanced Practice Midwife

## 2020-09-26 ENCOUNTER — Telehealth: Payer: Self-pay | Admitting: Family

## 2020-09-26 NOTE — Telephone Encounter (Signed)
lft pt a vm to call ofc to cancel [place] referral if not needed or to call [phone] option 3 and then 2 to sch. thanks

## 2020-10-09 ENCOUNTER — Other Ambulatory Visit: Payer: Self-pay | Admitting: Advanced Practice Midwife

## 2020-10-09 DIAGNOSIS — Z30016 Encounter for initial prescription of transdermal patch hormonal contraceptive device: Secondary | ICD-10-CM

## 2020-10-09 DIAGNOSIS — Z0000 Encounter for general adult medical examination without abnormal findings: Secondary | ICD-10-CM

## 2020-10-11 ENCOUNTER — Other Ambulatory Visit: Payer: BC Managed Care – PPO

## 2020-10-12 DIAGNOSIS — Z20822 Contact with and (suspected) exposure to covid-19: Secondary | ICD-10-CM | POA: Diagnosis not present

## 2020-10-13 ENCOUNTER — Ambulatory Visit: Payer: BC Managed Care – PPO | Admitting: Advanced Practice Midwife

## 2020-10-18 ENCOUNTER — Telehealth: Payer: Self-pay | Admitting: Family

## 2020-10-18 NOTE — Telephone Encounter (Signed)
Patient called and said she was returning referrrals phone call for a ultrasound

## 2020-10-26 ENCOUNTER — Ambulatory Visit (HOSPITAL_BASED_OUTPATIENT_CLINIC_OR_DEPARTMENT_OTHER)
Admission: RE | Admit: 2020-10-26 | Discharge: 2020-10-26 | Disposition: A | Discharge status: Home / Self Care | Payer: BC Managed Care – PPO | Source: Ambulatory Visit | Attending: Internal Medicine | Admitting: Internal Medicine

## 2020-10-26 ENCOUNTER — Other Ambulatory Visit: Payer: Self-pay

## 2020-10-26 DIAGNOSIS — R1084 Generalized abdominal pain: Secondary | ICD-10-CM | POA: Diagnosis not present

## 2020-10-26 DIAGNOSIS — R102 Pelvic and perineal pain: Secondary | ICD-10-CM | POA: Insufficient documentation

## 2020-10-26 DIAGNOSIS — R109 Unspecified abdominal pain: Secondary | ICD-10-CM | POA: Diagnosis not present

## 2020-10-26 DIAGNOSIS — R140 Abdominal distension (gaseous): Secondary | ICD-10-CM | POA: Diagnosis not present

## 2020-11-01 ENCOUNTER — Encounter: Payer: Self-pay | Admitting: Advanced Practice Midwife

## 2020-11-01 ENCOUNTER — Other Ambulatory Visit: Payer: Self-pay

## 2020-11-01 ENCOUNTER — Ambulatory Visit (INDEPENDENT_AMBULATORY_CARE_PROVIDER_SITE_OTHER): Payer: BC Managed Care – PPO | Admitting: Advanced Practice Midwife

## 2020-11-01 VITALS — BP 100/64 | HR 95 | Ht 64.0 in | Wt 118.0 lb

## 2020-11-01 DIAGNOSIS — Z0000 Encounter for general adult medical examination without abnormal findings: Secondary | ICD-10-CM

## 2020-11-01 DIAGNOSIS — Z3045 Encounter for surveillance of transdermal patch hormonal contraceptive device: Secondary | ICD-10-CM | POA: Diagnosis not present

## 2020-11-01 MED ORDER — XULANE 150-35 MCG/24HR TD PTWK: MEDICATED_PATCH | TRANSDERMAL | 3 refills | Status: DC

## 2020-11-02 NOTE — Progress Notes (Signed)
Gynecology Annual Exam  PCP: [name], FNP  Chief Complaint:  Chief Complaint  Patient presents with   Annual Exam    History of Present Illness: Patient is a 20 y.o. G0P0000 presents for annual exam. The patient has complaint today of 1-2 days of lower abdominal pain weekly when she puts new contraceptive patch on. The pain is not severe enough to cause her to change treatment. She did have a normal abdominal u/s on 6/16 which did not include pelvis. She had a 1.8 cm complex right ovarian cyst noted on pelvic u/s in 2020. Plan at that time was to address IBS symptoms with PCP first.  LMP: Patient's last menstrual period was 10/16/2020. Menarche:14 Average Interval: regular, 28 days Duration of flow: 5 days Heavy Menses: 2nd to 4th days Clots: no Intermenstrual Bleeding: no Postcoital Bleeding: no Dysmenorrhea: weekly with patch  The patient is sexually active. She currently uses Ortho-Evra patches weekly for contraception. She denies dyspareunia.  The patient  does occasionally  perform self breast exams.  There is no notable family history of breast or ovarian cancer in her family.  The patient wears seatbelts: yes.  The patient has regular exercise:  she walks regularly and works out at gym once per week, she admits healthy diet, adequate hydration and sleep. She usually has a daily soda.  The patient denies current symptoms of depression.    Review of Systems: Review of Systems  Constitutional:  Negative for chills and fever.  HENT:  Negative for congestion, ear discharge, ear pain, hearing loss, sinus pain and sore throat.   Eyes:  Negative for blurred vision and double vision.  Respiratory:  Negative for cough, shortness of breath and wheezing.   Cardiovascular:  Negative for chest pain, palpitations and leg swelling.  Gastrointestinal:  Positive for abdominal pain. Negative for blood in stool, constipation, diarrhea, heartburn, melena, nausea and vomiting.   Genitourinary:  Negative for dysuria, flank pain, frequency, hematuria and urgency.  Musculoskeletal:  Negative for back pain, joint pain and myalgias.  Skin:  Negative for itching and rash.  Neurological:  Negative for dizziness, tingling, tremors, sensory change, speech change, focal weakness, seizures, loss of consciousness, weakness and headaches.  Endo/Heme/Allergies:  Negative for environmental allergies. Does not bruise/bleed easily.  Psychiatric/Behavioral:  Negative for depression, hallucinations, memory loss, substance abuse and suicidal ideas. The patient is not nervous/anxious and does not have insomnia.    Past Medical History:  Patient Active Problem List   Diagnosis Date Noted   Alternating constipation and diarrhea 08/30/2020   Hematuria 08/30/2020   Vitamin D deficiency 08/11/2020   Other fatigue 04/19/2018   Pelvic pain 04/19/2018   Bloating 04/19/2018   Concentration deficit 02/25/2018   Chronic pain of both feet 02/25/2018   Anxiety and depression 02/23/2018    Past Surgical History:  Past Surgical History:  Procedure Laterality Date   NO PAST SURGERIES      Gynecologic History:  Patient's last menstrual period was 10/16/2020. Contraception: Ortho-Evra patches weekly Last Pap: First PAP will be next year at age 50  Obstetric History: G0P0000  Family History:  Family History  Problem Relation Age of Onset   Hypertension Father    Breast cancer Neg Hx    Colon cancer Neg Hx     Social History:  Social History   Socioeconomic History   Marital status: Single    Spouse name: Not on file   Number of children: Not on file  Years of education: Not on file   Highest education level: Not on file  Occupational History   Not on file  Tobacco Use   Smoking status: Never   Smokeless tobacco: Never  Vaping Use   Vaping Use: Never used  Substance and Sexual Activity   Alcohol use: No   Drug use: No   Sexual activity: Yes    Birth  control/protection: Patch  Other Topics Concern   Not on file  Social History Narrative   At [employer]- senior.    AD in science   Works 2 nights and [employer]    Social Determinants of Corporate investment banker Strain: Not on file  Food Insecurity: Not on file  Transportation Needs: Not on file  Physical Activity: Not on file  Stress: Not on file  Social Connections: Not on file  Intimate Partner Violence: Not on file    Allergies:  No Known Allergies  Medications: Prior to Admission medications   Medication Sig Start Date End Date Taking? Authorizing Provider  fexofenadine (ALLEGRA) 180 MG tablet Take 180 mg by mouth daily.   Yes [provider]  VITAMIN D PO Take by mouth.   Yes [provider]  norelgestromin-ethinyl estradiol [employer]) 150-35 MCG/24HR transdermal patch APPLY 1 PATCH ONCE A WEEK for 3 weeks 11/01/20   Tresea Mall, CNM    Physical Exam Vital Signs: BP 100/64 (Cuff Size: Normal)   Pulse 95   Ht 5\' 4"  (1.626 m)   Wt 118 lb (53.5 kg)   LMP 10/16/2020   BMI 20.25 kg/m  Constitutional: Well nourished, well developed female in no acute distress.  HEENT: normal Skin: Warm and dry.  Cardiovascular: Regular rate and rhythm.   Extremity:  no edema   Respiratory: Clear to auscultation bilateral. Normal respiratory effort Abdomen: soft, nontender, nondistended, no abnormal masses, no epigastric pain Back: no CVAT Neuro: DTRs 2+, Cranial nerves grossly intact Psych: Alert and Oriented x3. No memory deficits. Normal mood and affect.  MS: normal gait, normal bilateral lower extremity ROM/strength/stability.  Pelvic exam: deferred for no current concerns/had recent STD screen   Assessment: 20 y.o. G0P0000 routine annual exam  Plan: Problem List Items Addressed This Visit   None Visit Diagnoses     Well woman exam without gynecological exam    -  Primary   Relevant Medications   norelgestromin-ethinyl estradiol 26) 150-35 MCG/24HR  transdermal patch   Encounter for surveillance of transdermal patch hormonal contraceptive device       Relevant Medications   norelgestromin-ethinyl estradiol [employer]) 150-35 MCG/24HR transdermal patch       1) 4) Gardasil Series discussed and if applicable offered to patient - Patient has previously completed 2 of 3 shot series in 2014/2015   2) STI screening  was offered and declined  3)  ASCCP guidelines and rational discussed.  Patient opts for  beginning at age 72  screening interval  4) Contraception - the patient is currently using  Ortho-Evra patches weekly.  She is happy with her current form of contraception and plans to continue We discussed safe sex practices to reduce her furture risk of STI's.    5) Return in about 1 year (around 11/01/2021) for annual established gyn.  6. Follow up as needed for worsening pelvic pain symptoms   11/03/2021, CNM [facility] 11/02/2020, 11:34 AM

## 2020-11-29 ENCOUNTER — Ambulatory Visit: Payer: BC Managed Care – PPO | Admitting: Family

## 2020-12-26 ENCOUNTER — Ambulatory Visit
Admission: EM | Admit: 2020-12-26 | Discharge: 2020-12-26 | Disposition: A | Discharge status: Home / Self Care | Payer: BC Managed Care – PPO | Attending: Internal Medicine | Admitting: Internal Medicine

## 2020-12-26 ENCOUNTER — Other Ambulatory Visit: Payer: Self-pay

## 2020-12-26 DIAGNOSIS — N3091 Cystitis, unspecified with hematuria: Secondary | ICD-10-CM | POA: Insufficient documentation

## 2020-12-26 DIAGNOSIS — R310 Gross hematuria: Secondary | ICD-10-CM | POA: Insufficient documentation

## 2020-12-26 LAB — POCT URINALYSIS DIP (MANUAL ENTRY)
Glucose, UA: NEGATIVE mg/dL
Nitrite, UA: POSITIVE — AB
Protein Ur, POC: >=300 mg/dL — AB
Spec Grav, UA: 1.025 (ref 1.010–1.025)
Urobilinogen, UA: 4 E.U./dL — AB
pH, UA: 6.5 (ref 5.0–8.0)

## 2020-12-26 MED ORDER — SULFAMETHOXAZOLE-TRIMETHOPRIM 800-160 MG PO TABS: 1.0000 | ORAL_TABLET | Freq: Two times a day (BID) | ORAL | 0 refills | Status: AC

## 2020-12-26 MED ORDER — PHENAZOPYRIDINE HCL 200 MG PO TABS: 200.0000 mg | ORAL_TABLET | Freq: Three times a day (TID) | ORAL | 0 refills | Status: DC

## 2020-12-26 NOTE — Discharge Instructions (Addendum)
We will call you if your urine culture comes back showing we need to change your medication

## 2020-12-26 NOTE — ED Triage Notes (Signed)
Pt reports having hematuria and urinary frequency that began this morning.

## 2020-12-26 NOTE — ED Provider Notes (Signed)
[name]    CSN: [id number] Arrival date & time: 12/26/20  1352      History   Chief Complaint Chief Complaint  Patient presents with   Hematuria    HPI Cassandra Huff is a 20 y.o. female presents with onset of dysuria and hematuria one hour ago. Had similar thing 2 months ago. She does not only pass blood, but pieces of tissue. She followed up with her PCP since blood in her urine came back when done with the antibiotics, and the urine culture came back negative. LMP 1 week ago.     Past Medical History:  Diagnosis Date   Seasonal allergies    UTI (urinary tract infection)    E coli 08/16/20 tx'ed macrobid   Vitamin D deficiency     Patient Active Problem List   Diagnosis Date Noted   Alternating constipation and diarrhea 08/30/2020   Hematuria 08/30/2020   Vitamin D deficiency 08/11/2020   Other fatigue 04/19/2018   Pelvic pain 04/19/2018   Bloating 04/19/2018   Concentration deficit 02/25/2018   Chronic pain of both feet 02/25/2018   Anxiety and depression 02/23/2018    Past Surgical History:  Procedure Laterality Date   NO PAST SURGERIES      OB History     Gravida  0   Para  0   Term  0   Preterm  0   AB  0   Living  0      SAB  0   IAB  0   Ectopic  0   Multiple  0   Live Births  0            Home Medications    Prior to Admission medications   Medication Sig Start Date End Date Taking? Authorizing Provider  phenazopyridine (PYRIDIUM) 200 MG tablet Take 1 tablet (200 mg total) by mouth 3 (three) times daily. 12/26/20  Yes Rodriguez-Southworth, Nettie Elm, PA-C  sulfamethoxazole-trimethoprim (BACTRIM DS) 800-160 MG tablet Take 1 tablet by mouth 2 (two) times daily for 7 days. 12/26/20 01/02/21 Yes Rodriguez-Southworth, Nettie Elm, PA-C  fexofenadine (ALLEGRA) 180 MG tablet Take 180 mg by mouth daily.    [provider]  norelgestromin-ethinyl estradiol Burr Medico) 150-35 MCG/24HR transdermal patch APPLY 1 PATCH ONCE A WEEK  for 3 weeks 11/01/20   Tresea Mall, CNM  VITAMIN D PO Take by mouth.    [provider]    Family History Family History  Problem Relation Age of Onset   Hypertension Father    Breast cancer Neg Hx    Colon cancer Neg Hx     Social History Social History   Tobacco Use   Smoking status: Never   Smokeless tobacco: Never  Vaping Use   Vaping Use: Never used  Substance Use Topics   Alcohol use: No   Drug use: No     Allergies   Patient has no known allergies.   Review of Systems Review of Systems + dysuria, frequency, and blood in her urine. Denies flank pain, fever, chills or sweats   Physical Exam Triage Vital Signs ED Triage Vitals  Enc Vitals Group     BP 12/26/20 1413 133/86     Pulse Rate 12/26/20 1413 100     Resp 12/26/20 1413 16     Temp 12/26/20 1413 98.9 F (37.2 C)     Temp Source 12/26/20 1413 Oral     SpO2 12/26/20 1413 100 %     Weight 12/26/20  1414 115 lb (52.2 kg)     Height 12/26/20 1414 5\' 4"  (1.626 m)     Head Circumference --      Peak Flow --      Pain Score 12/26/20 1414 5     Pain Loc --      Pain Edu? --      Excl. in GC? --    No data found.  Updated Vital Signs BP 133/86   Pulse 100   Temp 98.9 F (37.2 C) (Oral)   Resp 16   Ht 5\' 4"  (1.626 m)   Wt 115 lb (52.2 kg)   SpO2 100%   BMI 19.74 kg/m   Visual Acuity Right Eye Distance:   Left Eye Distance:   Bilateral Distance:    Right Eye Near:   Left Eye Near:    Bilateral Near:     Physical Exam Physical Exam Vitals and nursing note reviewed.  Constitutional:      General: She is not in acute distress.    Appearance: She is not toxic-appearing.  HENT:     Head: Normocephalic.     Right Ear: External ear normal.     Left Ear: External ear normal.  Eyes:     General: No scleral icterus.    Conjunctiva/sclera: Conjunctivae normal.  Pulmonary:     Effort: Pulmonary effort is normal.  Abdominal:     General: Bowel sounds are normal.     Palpations:  Abdomen is soft. There is no mass.     Tenderness: There is no guarding or rebound.     Comments: - CVA tenderness   Musculoskeletal:        General: Normal range of motion.     Cervical back: Neck supple.     Comments: BACK- no muscular tenderness present Skin:    General: Skin is warm and dry.     Findings: No rash.  Neurological:     Mental Status: She is alert and oriented to person, place, and time.     Gait: Gait normal.  Psychiatric:        Mood and Affect: Mood normal.        Behavior: Behavior normal.        Thought Content: Thought content normal.        Judgment: Judgment normal.    UC Treatments / Results  Labs (all labs ordered are listed, but only abnormal results are displayed) Labs Reviewed  POCT URINALYSIS DIP (MANUAL ENTRY) - Abnormal; Notable for the following components:      Result Value   Color, UA other (*)    Clarity, UA cloudy (*)    Bilirubin, UA moderate (*)    Ketones, POC UA small (15) (*)    Blood, UA large (*)    Protein Ur, POC >=300 (*)    Urobilinogen, UA 4.0 (*)    Nitrite, UA Positive (*)    Leukocytes, UA Large (3+) (*)    All other components within normal limits  URINE CULTURE    EKG   Radiology No results found.  Procedures Procedures (including critical care time)  Medications Ordered in UC Medications - No data to display  Initial Impression / Assessment and Plan / UC Course  I have reviewed the triage vital signs and the nursing notes. Pertinent labs  results that were available during my care of the patient were reviewed by me and considered in my medical decision making (see chart for details). I placed  her on Bactrim DS and Pyridium. I sent her urine for a culture and we will inform her if her med needs changing. Needs to FU with urology.     Final Clinical Impressions(s) / UC Diagnoses   Final diagnoses:  Hemorrhagic cystitis     Discharge Instructions      We will call you if your urine culture comes  back showing we need to change your medication     ED Prescriptions     Medication Sig Dispense Auth. Provider   phenazopyridine (PYRIDIUM) 200 MG tablet Take 1 tablet (200 mg total) by mouth 3 (three) times daily. 6 tablet Rodriguez-Southworth, Nettie Elm, PA-C   sulfamethoxazole-trimethoprim (BACTRIM DS) 800-160 MG tablet Take 1 tablet by mouth 2 (two) times daily for 7 days. 14 tablet Rodriguez-Southworth, Nettie Elm, PA-C      PDMP not reviewed this encounter.   Garey Ham, PA-C 12/26/20 1452

## 2020-12-28 LAB — URINE CULTURE

## 2021-04-03 ENCOUNTER — Other Ambulatory Visit: Payer: Self-pay

## 2021-04-03 ENCOUNTER — Encounter: Payer: Self-pay | Admitting: Adult Health

## 2021-04-03 ENCOUNTER — Ambulatory Visit: Payer: BC Managed Care – PPO | Admitting: Adult Health

## 2021-04-03 VITALS — BP 116/74 | HR 114 | Temp 96.5°F | Ht 64.02 in | Wt 118.2 lb

## 2021-04-03 DIAGNOSIS — R350 Frequency of micturition: Secondary | ICD-10-CM | POA: Diagnosis not present

## 2021-04-03 DIAGNOSIS — N760 Acute vaginitis: Secondary | ICD-10-CM | POA: Diagnosis not present

## 2021-04-03 LAB — POCT URINALYSIS DIPSTICK
Bilirubin, UA: NEGATIVE
Blood, UA: NEGATIVE
Glucose, UA: NEGATIVE
Ketones, UA: NEGATIVE
Leukocytes, UA: NEGATIVE
Nitrite, UA: NEGATIVE
Protein, UA: NEGATIVE
Spec Grav, UA: 1.02 (ref 1.010–1.025)
Urobilinogen, UA: 0.2 E.U./dL
pH, UA: 6.5 (ref 5.0–8.0)

## 2021-04-03 LAB — URINALYSIS, MICROSCOPIC ONLY

## 2021-04-03 LAB — POCT URINE PREGNANCY: Preg Test, Ur: NEGATIVE

## 2021-04-03 MED ORDER — FLUCONAZOLE 150 MG PO TABS: ORAL_TABLET | ORAL | 0 refills | Status: DC

## 2021-04-03 NOTE — Progress Notes (Signed)
Negative pregnancy urine. Sent to [employer]

## 2021-04-03 NOTE — Progress Notes (Signed)
Negative urine microscopic, sent for urine culture.  Recommend follow up if symptoms not resolving for vaginal exam/ testing.

## 2021-04-03 NOTE — Patient Instructions (Signed)
Vaginitis Vaginitis is irritation and swelling of the vagina. Treatment will depend on the cause. What are the causes? It can be caused by: Bacteria. Yeast. A parasite. A virus. Low hormone levels. Bubble baths, scented tampons, and feminine sprays. Other things can change the balance of the yeast and bacteria that live in the vagina. These include: Antibiotic medicines. Not being clean enough. Some birth control methods. Sex. Infection. Diabetes. A weakened body defense system (immune system). What increases the risk? Smoking or being around someone who smokes. Using washes (douches), scented tampons, or scented pads. Wearing tight pants or thong underwear. Using birth control pills or an IUD. Having sex without a condom or having a lot of partners. Having an STI. Using a certain product to kill sperm (nonoxynol-9). Eating foods that are high in sugar. Having diabetes. Having low levels of a female hormone. Having a weakened body defense system. Being pregnant or breastfeeding. What are the signs or symptoms? Fluid coming from the vagina that is not normal. A bad smell. Itching, pain, or swelling. Pain with sex. Pain or burning when you pee (urinate). Sometimes there are no symptoms. How is this treated? Treatment may include: Antibiotic creams or pills. Antifungal medicines. Medicines to ease symptoms if you have a virus. Your sex partner should also be treated. Estrogen medicines. Avoiding scented soaps, sprays, or douches. Stopping use of products that caused irritation and then using a cream to treat symptoms. Follow these instructions at home: Lifestyle Keep the area around your vagina clean and dry. Avoid using soap. Rinse the area with water. Until your doctor says it is okay: Do not use washes for the vagina. Do not use tampons. Do not have sex. Wipe from front to back after going to the bathroom. When your doctor says it is okay, practice safe sex and  use condoms. General instructions Take over-the-counter and prescription medicines only as told by your doctor. If you were prescribed an antibiotic medicine, take or use it as told by your doctor. Do not stop taking or using it even if you start to feel better. Keep all follow-up visits. How is this prevented? Do not use things that can irritate the vagina, such as fabric softeners. Avoid these products if they are scented: Sprays. Detergents. Tampons. Products for cleaning the vagina. Soaps or bubble baths. Let air reach your vagina. To do this: Wear cotton underwear. Do not wear: Underwear while you sleep. Tight pants. Thong underwear. Underwear or nylons without a cotton panel. Take off any wet clothing, such as bathing suits, as soon as you can. Practice safe sex and use condoms. Contact a doctor if: You have pain in your belly or in the area between your hips. You have a fever or chills. Your symptoms last for more than 2-3 days. Get help right away if: You have a fever and your symptoms get worse all of a sudden. Summary Vaginitis is irritation and swelling of the vagina. Treatment will depend on the cause of the condition. Do not use washes or tampons or have sex until your doctor says it is okay. This information is not intended to replace advice given to you by your health care provider. Make sure you discuss any questions you have with your health care provider. Document Revised: 10/28/2019 Document Reviewed: 10/28/2019 Elsevier Patient Education  2022 Elsevier Inc. Fluconazole Tablets What is this medication? FLUCONAZOLE (floo KON na zole) prevents and treats fungal or yeast infections. It belongs to a group of medications called antifungals.  It will not prevent or treat colds, the flu, or infections caused by bacteria or viruses. This medicine may be used for other purposes; ask your health care provider or pharmacist if you have questions. COMMON BRAND NAME(S):  Diflucan What should I tell my care team before I take this medication? They need to know if you have any of these conditions: Irregular heartbeat or rhythm Kidney disease Liver disease Low levels of potassium in the blood An unusual or allergic reaction to fluconazole, other azole antifungals, medications, foods, dyes, or preservatives Pregnant or trying to get pregnant Breast-feeding How should I use this medication? Take this medication by mouth. Follow the directions on the prescription label. Do not take your medication more often than directed. Talk to your care team about the use of this medication in children. Special care may be needed. This medication has been used in children as young as 24 months of age. Overdosage: If you think you have taken too much of this medicine contact a poison control center or emergency room at once. NOTE: This medicine is only for you. Do not share this medicine with others. What if I miss a dose? If you miss a dose, take it as soon as you can. If it is almost time for your next dose, take only that dose. Do not take double or extra doses. What may interact with this medication? Do not take this medication with any of the following medications: Flibanserin Lomitapide Lonafarnib Other medications that prolong the QT interval (cause an abnormal heart rhythm) Triazolam This medication may also interact with the following medications: Certain antibiotics like rifabutin, rifampin Certain antivirals for HIV or hepatitis Certain medications for blood pressure, heart disease, irregular heartbeat Certain medications for cholesterol like atorvastatin, lovastatin, and simvastatin Certain medications for depression, like amitriptyline, nortriptyline Certain medications for diabetes like glipizide or glyburide Certain medications for seizures like carbamazepine, phenytoin Certain medications that treat or prevent blood clots like warfarin Certain narcotic  medications for pain like alfentanil, fentanyl, methadone Cyclophosphamide Cyclosporine Ibrutinib Lemborexant Midazolam NSAIDS, medications for pain and inflammation, like ibuprofen or naproxen Olaparib Sirolimus Steroid medications like prednisone Tacrolimus Theophylline Tofacitinib Tolvaptan Vinblastine Vincristine Vitamin A Voriconazole This list may not describe all possible interactions. Give your health care provider a list of all the medicines, herbs, non-prescription drugs, or dietary supplements you use. Also tell them if you smoke, drink alcohol, or use illegal drugs. Some items may interact with your medicine. What should I watch for while using this medication? Visit your care team for regular checkups. If you are taking this medication for a long time you may need blood work. Tell your care team if your symptoms do not improve. Some fungal infections need many weeks or months of treatment to cure. Alcohol can increase possible damage to your liver. Avoid alcoholic drinks. If you have a vaginal infection, do not have sex until you have finished your treatment. You can wear a sanitary napkin. Do not use tampons. Wear freshly washed cotton, not synthetic, panties. What side effects may I notice from receiving this medication? Side effects that you should report to your care team as soon as possible: Allergic reactions--skin rash, itching, hives, swelling of the face, lips, tongue, or throat Heart rhythm changes--fast or irregular heartbeat, dizziness, feeling faint or lightheaded, chest pain, trouble breathing Liver injury--right upper belly pain, loss of appetite, nausea, light-colored stool, dark yellow or brown urine, yellowing skin or eyes, unusual weakness or fatigue Low adrenal gland function--nausea, vomiting,  loss of appetite, unusual weakness or fatigue, dizziness Rash, fever, and swollen lymph nodes Redness, blistering, peeling, or loosening of the skin, including  inside the mouth Seizures Side effects that usually do not require medical attention (report to your care team if they continue or are bothersome): Change in taste Diarrhea Dizziness Headache Nausea Stomach pain This list may not describe all possible side effects. Call your doctor for medical advice about side effects. You may report side effects to FDA at 1-800-FDA-1088. Where should I keep my medication? Keep out of the reach of children. Store at room temperature below 30 degrees C (86 degrees F). Throw away any medication after the expiration date. NOTE: This sheet is a summary. It may not cover all possible information. If you have questions about this medicine, talk to your doctor, pharmacist, or health care provider.  2022 Elsevier/Gold Standard (2021-01-16 00:00:00)

## 2021-04-03 NOTE — Progress Notes (Signed)
Negative urine microscopic. Urine culture pending.
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
## 2021-04-04 LAB — URINE CULTURE
MICRO NUMBER:: 12669871
Result:: NO GROWTH
SPECIMEN QUALITY:: ADEQUATE

## 2021-05-21 ENCOUNTER — Encounter: Payer: Self-pay | Admitting: Family

## 2021-05-21 ENCOUNTER — Ambulatory Visit: Payer: BC Managed Care – PPO | Admitting: Family

## 2021-05-21 ENCOUNTER — Other Ambulatory Visit: Payer: Self-pay

## 2021-05-21 VITALS — BP 102/72 | HR 93 | Temp 98.6°F | Ht 64.0 in | Wt 117.6 lb

## 2021-05-21 DIAGNOSIS — M79671 Pain in right foot: Secondary | ICD-10-CM

## 2021-05-21 DIAGNOSIS — F32A Depression, unspecified: Secondary | ICD-10-CM

## 2021-05-21 DIAGNOSIS — F419 Anxiety disorder, unspecified: Secondary | ICD-10-CM

## 2021-05-21 DIAGNOSIS — M79672 Pain in left foot: Secondary | ICD-10-CM

## 2021-05-21 DIAGNOSIS — R059 Cough, unspecified: Secondary | ICD-10-CM | POA: Diagnosis not present

## 2021-05-21 HISTORY — DX: Cough, unspecified: R05.9

## 2021-05-21 MED ORDER — BENZONATATE 100 MG PO CAPS: 100.0000 mg | ORAL_CAPSULE | Freq: Three times a day (TID) | ORAL | 1 refills | Status: DC | PRN

## 2021-05-21 NOTE — Assessment & Plan Note (Signed)
Suboptimal control.  Referral back to counselor.  She declines medication management at this time.  Will follow closely

## 2021-05-21 NOTE — Patient Instructions (Addendum)
Start [employer] as needed for cough.  As discussed, if symptoms fail to continue to improve or stall,  most certainly worsen, please call and let me know right away   referral to both counseling and podiatry Let us know if you dont hear back within a week in regards to an appointment being scheduled.   Nice to see you!

## 2021-05-21 NOTE — Progress Notes (Signed)
Subjective:    Patient ID: Cassandra Huff, female    DOB: Aug 28, 2000, 20 y.o.   MRN: [id number]  CC: Cassandra KingfisherHannah Huff is a 21 y.o. female who presents today for follow up.   HPI: Complains of productive cough x 2 weeks, slowing getting better. Cough is all day, at night has improved.  No fever, sob.  sinus pain and sore throat resolved. She has been taking mucinex, dayquil with some relief.   No h/o smoker, asthma.   She complains of bilateral feet pain. No swelling. No injury. No pain when getting out of bed. She would like referral to podiatry for assistance with arch support. She reports 'flat feet' and she has seen podiatry in the past for custom arch       She also would like a referral to counseling whom she is seen in the past and found to be helpful.  She declines any medication therapy at this time.  Denies depression.  She describes anxiety during the day particularly around some of her relationships. HISTORY:  Past Medical History:  Diagnosis Date   Seasonal allergies    UTI (urinary tract infection)    E coli 08/16/20 tx'ed macrobid   Vitamin D deficiency    Past Surgical History:  Procedure Laterality Date   NO PAST SURGERIES     Family History  Problem Relation Age of Onset   Hypertension Father    Breast cancer Neg Hx    Colon cancer Neg Hx     Allergies: Patient has no known allergies. Current Outpatient Medications on File Prior to Visit  Medication Sig Dispense Refill   VITAMIN D PO Take by mouth.     fexofenadine (ALLEGRA) 180 MG tablet Take 180 mg by mouth daily. (Patient not taking: Reported on 05/21/2021)     norelgestromin-ethinyl estradiol Burr Medico(XULANE) 150-35 MCG/24HR transdermal patch APPLY 1 PATCH ONCE A WEEK for 3 weeks 9 patch 3   No current facility-administered medications on file prior to visit.    Social History   Tobacco Use   Smoking status: Never   Smokeless tobacco: Never  Vaping Use   Vaping Use: Never used  Substance Use Topics   Alcohol  use: No   Drug use: No    Review of Systems  Constitutional:  Negative for chills and fever.  HENT:  Positive for congestion. Negative for sore throat (resolved).   Respiratory:  Positive for cough. Negative for shortness of breath.   Cardiovascular:  Negative for chest pain and palpitations.  Gastrointestinal:  Negative for nausea and vomiting.     Objective:    BP 102/72 (BP Location: Left Arm, Patient Position: Sitting, Cuff Size: Normal)    Pulse 93    Temp 98.6 F (37 C) (Oral)    Ht 5\' 4"  (1.626 m)    Wt 117 lb 9.6 oz (53.3 kg)    SpO2 97%    BMI 20.19 kg/m  BP Readings from Last 3 Encounters:  05/21/21 102/72  04/03/21 116/74  12/26/20 133/86   Wt Readings from Last 3 Encounters:  05/21/21 117 lb 9.6 oz (53.3 kg)  04/03/21 118 lb 3.2 oz (53.6 kg)  12/26/20 115 lb (52.2 kg)    Physical Exam Vitals reviewed.  Constitutional:      Appearance: She is well-developed.  HENT:     Head: Normocephalic and atraumatic.     Right Ear: Hearing, tympanic membrane, ear canal and external ear normal. No decreased hearing noted. No drainage, swelling or  tenderness. No middle ear effusion. No foreign body. Tympanic membrane is not erythematous or bulging.     Left Ear: Hearing, tympanic membrane, ear canal and external ear normal. No decreased hearing noted. No drainage, swelling or tenderness.  No middle ear effusion. No foreign body. Tympanic membrane is not erythematous or bulging.     Nose: Nose normal. No rhinorrhea.     Right Sinus: No maxillary sinus tenderness or frontal sinus tenderness.     Left Sinus: No maxillary sinus tenderness or frontal sinus tenderness.     Mouth/Throat:     Pharynx: Uvula midline. Posterior oropharyngeal erythema present. No oropharyngeal exudate.     Tonsils: No tonsillar abscesses.  Eyes:     Conjunctiva/sclera: Conjunctivae normal.  Cardiovascular:     Rate and Rhythm: Regular rhythm.     Pulses: Normal pulses.     Heart sounds: Normal heart  sounds.  Pulmonary:     Effort: Pulmonary effort is normal.     Breath sounds: Normal breath sounds. No wheezing, rhonchi or rales.  Lymphadenopathy:     Head:     Right side of head: No submental, submandibular, tonsillar, preauricular, posterior auricular or occipital adenopathy.     Left side of head: No submental, submandibular, tonsillar, preauricular, posterior auricular or occipital adenopathy.     Cervical: No cervical adenopathy.  Skin:    General: Skin is warm and dry.  Neurological:     Mental Status: She is alert.  Psychiatric:        Speech: Speech normal.        Behavior: Behavior normal.        Thought Content: Thought content normal.       Assessment & Plan:   Problem List Items Addressed This Visit       Other   Anxiety and depression    Suboptimal control.  Referral back to counselor.  She declines medication management at this time.  Will follow closely      Relevant Orders   Ambulatory referral to Psychology   Cough - Primary    Improved.  No acute respiratory distress.  She is afebrile. Strep negative.  As patient continues to improve, I provided her with Jerilynn Som to use as needed.  Strongly emphasized that if symptoms do not continue to improve, if they were to stall, or worsen to let me know right away as I would recommend antibiotic therapy at that time       Relevant Medications   benzonatate (TESSALON) 100 MG capsule   Other Relevant Orders   POCT rapid strep A   Pain in both feet   Relevant Orders   Ambulatory referral to Podiatry     I have discontinued Maleah Cast's phenazopyridine and fluconazole. I am also having her start on benzonatate. Additionally, I am having her maintain her fexofenadine, VITAMIN D PO, and Xulane.   Meds ordered this encounter  Medications   benzonatate (TESSALON) 100 MG capsule    Sig: Take 1 capsule (100 mg total) by mouth 3 (three) times daily as needed for cough.    Dispense:  20 capsule    Refill:   1    Order Specific Question:   Supervising Provider    Answer:   Sherlene Shams [[id number]]    Return precautions given.   Risks, benefits, and alternatives of the medications and treatment plan prescribed today were discussed, and patient expressed understanding.   Education regarding symptom management and diagnosis given to  patient on AVS.  Continue to follow with [name], FNP for routine health maintenance.   Cassandra Huff and I agreed with plan.   Rennie Plowman, FNP

## 2021-05-21 NOTE — Assessment & Plan Note (Addendum)
Improved.  No acute respiratory distress.  She is afebrile. Strep negative.  As patient continues to improve, I provided her with Cassandra Huff to use as needed.  Strongly emphasized that if symptoms do not continue to improve, if they were to stall, or worsen to let me know right away as I would recommend antibiotic therapy at that time

## 2021-05-22 LAB — POCT RAPID STREP A (OFFICE): Rapid Strep A Screen: NEGATIVE

## 2021-05-28 ENCOUNTER — Ambulatory Visit
Admission: EM | Admit: 2021-05-28 | Discharge: 2021-05-28 | Disposition: A | Discharge status: Home / Self Care | Payer: BC Managed Care – PPO | Attending: Physician Assistant | Admitting: Physician Assistant

## 2021-05-28 ENCOUNTER — Encounter: Payer: Self-pay | Admitting: Emergency Medicine

## 2021-05-28 DIAGNOSIS — N390 Urinary tract infection, site not specified: Secondary | ICD-10-CM | POA: Insufficient documentation

## 2021-05-28 LAB — POCT URINALYSIS DIP (MANUAL ENTRY)
Bilirubin, UA: NEGATIVE
Glucose, UA: NEGATIVE mg/dL
Ketones, POC UA: NEGATIVE mg/dL
Nitrite, UA: NEGATIVE
Protein Ur, POC: NEGATIVE mg/dL
Spec Grav, UA: 1.01 (ref 1.010–1.025)
Urobilinogen, UA: 0.2 E.U./dL
pH, UA: 6.5 (ref 5.0–8.0)

## 2021-05-28 LAB — POCT URINE PREGNANCY: Preg Test, Ur: NEGATIVE

## 2021-05-28 MED ORDER — CEPHALEXIN 500 MG PO CAPS: 500.0000 mg | ORAL_CAPSULE | Freq: Four times a day (QID) | ORAL | 0 refills | Status: AC

## 2021-05-28 MED ORDER — FLUCONAZOLE 150 MG PO TABS: 150.0000 mg | ORAL_TABLET | Freq: Every day | ORAL | 1 refills | Status: DC

## 2021-05-28 NOTE — ED Triage Notes (Signed)
Pt here with white, vaginal discharge x 2 days. Burning during urination and lower abdominal pain since yesterday.

## 2021-05-28 NOTE — ED Provider Notes (Signed)
Cassandra Huff    CSN: [id number] Arrival date & time: 05/28/21  1815      History   Chief Complaint Chief Complaint  Patient presents with   Dysuria   Vaginal Discharge    HPI Cassandra Huff is a 21 y.o. female.   Pt fells like she has a uti. Pt also concerned about yeast  The history is provided by the patient. No language interpreter was used.  Dysuria Pain quality:  Aching Pain severity:  Mild Onset quality:  Gradual Duration:  2 days Timing:  Constant Progression:  Worsening Chronicity:  New Relieved by:  Nothing Ineffective treatments:  None tried Associated symptoms: vaginal discharge   Vaginal Discharge Associated symptoms: dysuria    Past Medical History:  Diagnosis Date   Seasonal allergies    UTI (urinary tract infection)    E coli 08/16/20 tx'ed macrobid   Vitamin D deficiency     Patient Active Problem List   Diagnosis Date Noted   Cough 05/21/2021   Alternating constipation and diarrhea 08/30/2020   Hematuria 08/30/2020   Vitamin D deficiency 08/11/2020   Other fatigue 04/19/2018   Pelvic pain 04/19/2018   Bloating 04/19/2018   Concentration deficit 02/25/2018   Pain in both feet 02/25/2018   Anxiety and depression 02/23/2018    Past Surgical History:  Procedure Laterality Date   NO PAST SURGERIES      OB History     Gravida  0   Para  0   Term  0   Preterm  0   AB  0   Living  0      SAB  0   IAB  0   Ectopic  0   Multiple  0   Live Births  0            Home Medications    Prior to Admission medications   Medication Sig Start Date End Date Taking? Authorizing Provider  cephALEXin (KEFLEX) 500 MG capsule Take 1 capsule (500 mg total) by mouth 4 (four) times daily for 7 days. 05/28/21 06/04/21 Yes Fransico Meadow, PA-C  fluconazole (DIFLUCAN) 150 MG tablet Take 1 tablet (150 mg total) by mouth daily. 05/28/21  Yes Caryl Ada K, PA-C  benzonatate (TESSALON) 100 MG capsule Take 1 capsule (100 mg total)  by mouth 3 (three) times daily as needed for cough. 05/21/21   Burnard Hawthorne, FNP  fexofenadine (ALLEGRA) 180 MG tablet Take 180 mg by mouth daily. Patient not taking: Reported on 05/21/2021    [provider]  norelgestromin-ethinyl estradiol Marilu Favre) 150-35 MCG/24HR transdermal patch APPLY 1 PATCH ONCE A WEEK for 3 weeks 11/01/20   Rod Can, CNM  VITAMIN D PO Take by mouth.    [provider]    Family History Family History  Problem Relation Age of Onset   Hypertension Father    Breast cancer Neg Hx    Colon cancer Neg Hx     Social History Social History   Tobacco Use   Smoking status: Never   Smokeless tobacco: Never  Vaping Use   Vaping Use: Never used  Substance Use Topics   Alcohol use: No   Drug use: No     Allergies   Patient has no known allergies.   Review of Systems Review of Systems  Genitourinary:  Positive for dysuria and vaginal discharge.  All other systems reviewed and are negative.   Physical Exam Triage Vital Signs ED Triage Vitals  Enc Vitals Group     BP 05/28/21 1834 128/82     Pulse Rate 05/28/21 1834 (!) 102     Resp 05/28/21 1834 18     Temp 05/28/21 1834 99.3 F (37.4 C)     Temp src --      SpO2 05/28/21 1834 100 %     Weight --      Height --      Head Circumference --      Peak Flow --      Pain Score 05/28/21 1837 3     Pain Loc --      Pain Edu? --      Excl. in [place]? --    No data found.  Updated Vital Signs BP 128/82    Pulse (!) 102    Temp 99.3 F (37.4 C)    Resp 18    SpO2 100%   Visual Acuity Right Eye Distance:   Left Eye Distance:   Bilateral Distance:    Right Eye Near:   Left Eye Near:    Bilateral Near:     Physical Exam Vitals and nursing note reviewed.  Constitutional:      Appearance: She is well-developed.  HENT:     Head: Normocephalic.  Cardiovascular:     Rate and Rhythm: Normal rate.  Pulmonary:     Effort: Pulmonary effort is normal.  Abdominal:     General:  There is no distension.  Musculoskeletal:        General: Normal range of motion.     Cervical back: Normal range of motion.  Skin:    General: Skin is warm.  Neurological:     General: No focal deficit present.     Mental Status: She is alert and oriented to person, place, and time.     UC Treatments / Results  Labs (all labs ordered are listed, but only abnormal results are displayed) Labs Reviewed  POCT URINALYSIS DIP (MANUAL ENTRY) - Abnormal; Notable for the following components:      Result Value   Clarity, UA cloudy (*)    Blood, UA large (*)    Leukocytes, UA Moderate (2+) (*)    All other components within normal limits  POCT URINE PREGNANCY  CERVICOVAGINAL ANCILLARY ONLY    EKG   Radiology No results found.  Procedures Procedures (including critical care time)  Medications Ordered in UC Medications - No data to display  Initial Impression / Assessment and Plan / UC Course  I have reviewed the triage vital signs and the nursing notes.  Pertinent labs & imaging results that were available during my care of the patient were reviewed by me and considered in my medical decision making (see chart for details).     [id number] pending. Ua shows leukocytes and  Final Clinical Impressions(s) / UC Diagnoses   Final diagnoses:  Urinary tract infection without hematuria, site unspecified   Discharge Instructions   None    ED Prescriptions     Medication Sig Dispense Auth. Provider   cephALEXin (KEFLEX) 500 MG capsule Take 1 capsule (500 mg total) by mouth 4 (four) times daily for 7 days. 28 capsule Elise Knobloch K, [place]   fluconazole (DIFLUCAN) 150 MG tablet Take 1 tablet (150 mg total) by mouth daily. 1 tablet Fransico Meadow, [place]      PDMP not reviewed this encounter. An After Visit Summary was printed and given to the patient.    Fransico Meadow,  PA-C 05/28/21 1910

## 2021-05-29 ENCOUNTER — Encounter: Payer: Self-pay | Admitting: Podiatry

## 2021-05-29 ENCOUNTER — Ambulatory Visit (INDEPENDENT_AMBULATORY_CARE_PROVIDER_SITE_OTHER): Payer: BC Managed Care – PPO

## 2021-05-29 ENCOUNTER — Ambulatory Visit: Payer: BC Managed Care – PPO | Admitting: Podiatry

## 2021-05-29 ENCOUNTER — Other Ambulatory Visit: Payer: Self-pay

## 2021-05-29 DIAGNOSIS — M2141 Flat foot [pes planus] (acquired), right foot: Secondary | ICD-10-CM

## 2021-05-29 DIAGNOSIS — M2142 Flat foot [pes planus] (acquired), left foot: Secondary | ICD-10-CM | POA: Diagnosis not present

## 2021-05-29 DIAGNOSIS — M2140 Flat foot [pes planus] (acquired), unspecified foot: Secondary | ICD-10-CM

## 2021-05-29 MED ORDER — DICLOFENAC SODIUM 75 MG PO TBEC: 75.0000 mg | DELAYED_RELEASE_TABLET | Freq: Two times a day (BID) | ORAL | 1 refills | Status: DC

## 2021-05-29 NOTE — Patient Instructions (Signed)
Orthotics code is H8299. Call your insurance and see if they cover that code for orthotics.   If they cover orthotics, call the office and make an appt with Arlys John, our pedorthist, to be molded for orthotics.

## 2021-05-30 ENCOUNTER — Telehealth (HOSPITAL_COMMUNITY): Payer: Self-pay | Admitting: Emergency Medicine

## 2021-05-30 LAB — CERVICOVAGINAL ANCILLARY ONLY
Bacterial Vaginitis (gardnerella): POSITIVE — AB
Candida Glabrata: NEGATIVE
Candida Vaginitis: NEGATIVE
Chlamydia: NEGATIVE
Comment: NEGATIVE
Comment: NEGATIVE
Comment: NEGATIVE
Comment: NEGATIVE
Comment: NEGATIVE
Comment: NORMAL
Neisseria Gonorrhea: NEGATIVE
Trichomonas: NEGATIVE

## 2021-05-30 MED ORDER — METRONIDAZOLE 500 MG PO TABS: 500.0000 mg | ORAL_TABLET | Freq: Two times a day (BID) | ORAL | 0 refills | Status: DC

## 2021-06-07 NOTE — Progress Notes (Signed)
° °  Subjective:  21 year old female reestablish new patient presenting for evaluation of chronic bilateral foot pain secondary to pes planus deformity.  She was last seen in the office in 2019 at which time OTC insoles and arch supports were recommended.  Patient would like to look into the new custom molded orthotics.  She is on her feet throughout the day and they are very painful.  She has been soaking her feet in Epsom salt, taking ibuprofen and Tylenol as needed.   Past Medical History:  Diagnosis Date   Seasonal allergies    UTI (urinary tract infection)    E coli 08/16/20 tx'ed macrobid   Vitamin D deficiency    Past Surgical History:  Procedure Laterality Date   NO PAST SURGERIES     No Known Allergies   Objective/Physical Exam General: The patient is alert and oriented x3 in no acute distress.  Dermatology: Skin is warm, dry and supple bilateral lower extremities. Negative for open lesions or macerations.  Vascular: Palpable pedal pulses bilaterally. No edema or erythema noted. Capillary refill within normal limits.  Neurological: Epicritic and protective threshold grossly intact bilaterally.   Musculoskeletal Exam: Range of motion within normal limits to all pedal and ankle joints bilateral. Muscle strength 5/5 in all groups bilateral.  Upon weightbearing there is a medial longitudinal arch collapse bilaterally. Remove foot valgus noted to the bilateral lower extremities with excessive pronation upon mid stance.  Radiographic Exam:  Normal osseous mineralization. Joint spaces preserved. No fracture/dislocation/boney destruction.   Pes planus noted on radiographic exam lateral views. Decreased calcaneal inclination and metatarsal declination angle is noted. Anterior break in the cyma line noted on lateral views. Medial talar head to deviation noted on AP radiograph.   Assessment: 1. pes planus bilateral 2. pain in bilateral feet   Plan of Care:  1. Patient was evaluated.  X-Rays reviewed.  2.  Continue daily stretching exercises.  Continue good supportive shoes and sneakers 3.  The patient is going to call her insurance to see if they cover custom molded orthotics.  If they do cover the orthotics, call into the office for an appointment with Pedorthist for custom molded insoles 4.  Return to clinic annually   Felecia Shelling, DPM [facility]  Dr. Felecia Shelling, DPM    [address] [place], [place] [address]                Office [phone]  Fax [phone]

## 2021-06-22 ENCOUNTER — Other Ambulatory Visit (HOSPITAL_COMMUNITY)
Admission: RE | Admit: 2021-06-22 | Discharge: 2021-06-22 | Disposition: A | Discharge status: Home / Self Care | Payer: BC Managed Care – PPO | Source: Ambulatory Visit | Attending: Family | Admitting: Family

## 2021-06-22 ENCOUNTER — Encounter: Payer: Self-pay | Admitting: Family

## 2021-06-22 ENCOUNTER — Other Ambulatory Visit: Payer: Self-pay

## 2021-06-22 ENCOUNTER — Ambulatory Visit: Payer: BC Managed Care – PPO | Admitting: Family

## 2021-06-22 VITALS — BP 110/64 | HR 95 | Temp 98.9°F | Ht 64.0 in | Wt 116.2 lb

## 2021-06-22 DIAGNOSIS — F32A Depression, unspecified: Secondary | ICD-10-CM

## 2021-06-22 DIAGNOSIS — Z87440 Personal history of urinary (tract) infections: Secondary | ICD-10-CM | POA: Diagnosis not present

## 2021-06-22 DIAGNOSIS — R350 Frequency of micturition: Secondary | ICD-10-CM | POA: Diagnosis not present

## 2021-06-22 DIAGNOSIS — F419 Anxiety disorder, unspecified: Secondary | ICD-10-CM

## 2021-06-22 DIAGNOSIS — R102 Pelvic and perineal pain: Secondary | ICD-10-CM | POA: Insufficient documentation

## 2021-06-22 DIAGNOSIS — Z124 Encounter for screening for malignant neoplasm of cervix: Secondary | ICD-10-CM | POA: Diagnosis not present

## 2021-06-22 MED ORDER — FLUOXETINE HCL 20 MG PO CAPS: 20.0000 mg | ORAL_CAPSULE | Freq: Every morning | ORAL | 3 refills | Status: DC

## 2021-06-22 NOTE — Assessment & Plan Note (Signed)
Uncontrolled.  Unfortunately patient was unable to see counselor as we have yet to find one that would see her in person.  We will continue discussed to this.  We did agree in starting low-dose Prozac 20 mg.  Close follow-up

## 2021-06-22 NOTE — Assessment & Plan Note (Addendum)
No apparent symptoms today.  Reassuring pelvic exam.  She does have a history of a right ovarian cyst we have ordered an ultrasound transvaginal to further assess for ovarian, uterine pathology.  Reviewed chart was able to find one positive urine culture, e. Coli,  April of last year.  Referral to urology for further evaluation for recurrent nature of symptoms.  Repeat urinalysis urine culture to ensure no hematuria or concern for infection

## 2021-06-22 NOTE — Progress Notes (Signed)
Subjective:    Patient ID: Cassandra Huff, female    DOB: [birth year], 21 y.o.   MRN: [id number]  CC: [name] is a 21 y.o. female who presents today for follow up.   HPI: Anxiety has increased as of late and feels anxiety has been present 'my whole life' She is moving in with her boyfriend from dad's house.  Anxiety is effecting work.  Episodic depression with decreased motivation and harder to get up and get going. Symptoms exacerbated around menses.   She was unable to see counselor as she prefers in person.  Menses are monthly. No concern for pregnancy. She has h/o ovarian cyst and intermittent pelvic pain. She has not pain today.  No concern for STDs.  She also reports concern for recurrent urinary tract infections.  She endorses drinking caffeine and she notices urinary frequency.  No dysuria or urinary frequency today  Urine culture negative 11/22, 12/2020 Urine culture positive for 08/2020     History of right ovarian cyst seen pelvic US 06/30/18 HISTORY:  Past Medical History:  Diagnosis Date   Seasonal allergies    UTI (urinary tract infection)    E coli 08/16/20 tx'ed macrobid   Vitamin D deficiency    Past Surgical History:  Procedure Laterality Date   NO PAST SURGERIES     Family History  Problem Relation Age of Onset   Hypertension Father    Breast cancer Neg Hx    Colon cancer Neg Hx     Allergies: Patient has no known allergies. Current Outpatient Medications on File Prior to Visit  Medication Sig Dispense Refill   diclofenac (VOLTAREN) 75 MG EC tablet Take 1 tablet (75 mg total) by mouth 2 (two) times daily. 60 tablet 1   norelgestromin-ethinyl estradiol (XULANE) 150-35 MCG/24HR transdermal patch APPLY 1 PATCH ONCE A WEEK for 3 weeks 9 patch 3   VITAMIN D PO Take by mouth.     fexofenadine (ALLEGRA) 180 MG tablet Take 180 mg by mouth daily. (Patient not taking: Reported on 06/22/2021)     No current facility-administered medications on file prior to  visit.    Social History   Tobacco Use   Smoking status: Never   Smokeless tobacco: Never  Vaping Use   Vaping Use: Never used  Substance Use Topics   Alcohol use: No   Drug use: No    Review of Systems  Constitutional:  Negative for chills and fever.  Respiratory:  Negative for cough.   Cardiovascular:  Negative for chest pain and palpitations.  Gastrointestinal:  Negative for nausea and vomiting.  Genitourinary:  Positive for vaginal pain (occassional).     Objective:    BP 110/64 (BP Location: Left Arm, Patient Position: Sitting, Cuff Size: Normal)    Pulse 95    Temp 98.9 F (37.2 C) (Oral)    Ht 5\' 4"  (1.626 m)    Wt 116 lb 3.2 oz (52.7 kg)    SpO2 98%    BMI 19.95 kg/m  BP Readings from Last 3 Encounters:  06/22/21 110/64  05/28/21 128/82  05/21/21 102/72   Wt Readings from Last 3 Encounters:  06/22/21 116 lb 3.2 oz (52.7 kg)  05/21/21 117 lb 9.6 oz (53.3 kg)  04/03/21 118 lb 3.2 oz (53.6 kg)    Physical Exam Vitals reviewed.  Constitutional:      Appearance: She is well-developed.  Eyes:     Conjunctiva/sclera: Conjunctivae normal.  Cardiovascular:     Rate and Rhythm:  Normal rate and regular rhythm.     Pulses: Normal pulses.     Heart sounds: Normal heart sounds.  Pulmonary:     Effort: Pulmonary effort is normal.     Breath sounds: Normal breath sounds. No wheezing, rhonchi or rales.  Genitourinary:    Labia:        Right: No rash.        Left: No rash.      Vagina: No foreign body. No vaginal discharge, erythema, tenderness, bleeding or lesions.     Cervix: Normal. No eversion.     Uterus: Not enlarged and not tender.      Adnexa:        Right: No mass, tenderness or fullness.         Left: No mass, tenderness or fullness.       Comments: Pap collected of cervix.  No pain with bimanual exam Skin:    General: Skin is warm and dry.  Neurological:     Mental Status: She is alert.  Psychiatric:        Speech: Speech normal.        Behavior:  Behavior normal.        Thought Content: Thought content normal.       Assessment & Plan:   Problem List Items Addressed This Visit       Other   Anxiety and depression    Uncontrolled.  Unfortunately patient was unable to see counselor as we have yet to find one that would see her in person.  We will continue discussed to this.  We did agree in starting low-dose Prozac 20 mg.  Close follow-up      Relevant Medications   FLUoxetine (PROZAC) 20 MG capsule   Pelvic pain   Urinary frequency    No apparent symptoms today.  Reassuring pelvic exam.  She does have a history of a right ovarian cyst we have ordered an ultrasound transvaginal to further assess for ovarian, uterine pathology.  Reviewed chart was able to find one positive urine culture, e. Coli,  April of last year.  Referral to urology for further evaluation for recurrent nature of symptoms.  Repeat urinalysis urine culture to ensure no hematuria or concern for infection      Relevant Orders   Ambulatory referral to Urology   US PELVIC COMPLETE WITH TRANSVAGINAL   Urinalysis, Routine w reflex microscopic   Urine Culture   Other Visit Diagnoses     Screening for cervical cancer    -  Primary   Relevant Orders   Cytology - PAP( [place])        I have discontinued Ashonte Fairbairn's fluconazole and metroNIDAZOLE. I am also having her start on FLUoxetine. Additionally, I am having her maintain her fexofenadine, VITAMIN D PO, Xulane, and diclofenac.   Meds ordered this encounter  Medications   FLUoxetine (PROZAC) 20 MG capsule    Sig: Take 1 capsule (20 mg total) by mouth every morning.    Dispense:  90 capsule    Refill:  3    Order Specific Question:   Supervising Provider    Answer:   Crecencio Mc [[id number]]    Return precautions given.   Risks, benefits, and alternatives of the medications and treatment plan prescribed today were discussed, and patient expressed understanding.   Education regarding symptom  management and diagnosis given to patient on AVS.  Continue to follow with Burnard Hawthorne, FNP for routine health maintenance.  Tyler Deis and I agreed with plan.   Mable Paris, FNP

## 2021-06-22 NOTE — Patient Instructions (Signed)
I have ordered a pelvic ultrasound and also placed a referral to urology Let us know if you dont hear back within a week in regards to an appointment being scheduled.   Start Prozac 20 mg.  Please let me know how you are doing  Our hope is for gradual improvement of mood since starting medication; however this may take several weeks.   If you start to have unusual thoughts, thoughts of hurting yourself, or anyone else, please go immediately to the emergency department.   Follow up in 6 weeks.   Please text to [phone] and write the word 'home'. This will put you in touch with trained crisis counselor and resources.    National Suicide Prevention Hotline - available 24 hours a day, 7 days a week.  [phone]  Major Depressive Disorder Major depressive disorder is a mental illness. It also may be called clinical depression or unipolar depression. Major depressive disorder usually causes feelings of sadness, hopelessness, or helplessness. Some people with this disorder do not feel particularly sad but lose interest in doing things they used to enjoy (anhedonia). Major depressive disorder also can cause physical symptoms. It can interfere with work, school, relationships, and other normal everyday activities. The disorder varies in severity but is longer lasting and more serious than the sadness we all feel from time to time in our lives. Major depressive disorder often is triggered by stressful life events or major life changes. Examples of these triggers include divorce, loss of your job or home, a move, and the death of a family member or close friend. Sometimes this disorder occurs for no obvious reason at all. People who have family members with major depressive disorder or bipolar disorder are at higher risk for developing this disorder, with or without life stressors. Major depressive disorder can occur at any age. It may occur just once in your life (single episode major depressive  disorder). It may occur multiple times (recurrent major depressive disorder). SYMPTOMS People with major depressive disorder have either anhedonia or depressed mood on nearly a daily basis for at least 2 weeks or longer. Symptoms of depressed mood include: Feelings of sadness (blue or down in the dumps) or emptiness. Feelings of hopelessness or helplessness. Tearfulness or episodes of crying (may be observed by others). Irritability (children and adolescents). In addition to depressed mood or anhedonia or both, people with this disorder have at least four of the following symptoms: Difficulty sleeping or sleeping too much.   Significant change (increase or decrease) in appetite or weight.   Lack of energy or motivation. Feelings of guilt and worthlessness.   Difficulty concentrating, remembering, or making decisions. Unusually slow movement (psychomotor retardation) or restlessness (as observed by others).   Recurrent wishes for death, recurrent thoughts of self-harm (suicide), or a suicide attempt. People with major depressive disorder commonly have persistent negative thoughts about themselves, other people, and the world. People with severe major depressive disorder may experience distorted beliefs or perceptions about the world (psychotic delusions). They also may see or hear things that are not real (psychotic hallucinations). DIAGNOSIS Major depressive disorder is diagnosed through an assessment by your health care provider. Your health care provider will ask about aspects of your daily life, such as mood, sleep, and appetite, to see if you have the diagnostic symptoms of major depressive disorder. Your health care provider may ask about your medical history and use of alcohol or drugs, including prescription medicines. Your health care provider also may do a  physical exam and blood work. This is because certain medical conditions and the use of certain substances can cause major depressive  disorder-like symptoms (secondary depression). Your health care provider also may refer you to a mental health specialist for further evaluation and treatment. TREATMENT It is important to recognize the symptoms of major depressive disorder and seek treatment. The following treatments can be prescribed for this disorder:   Medicine. Antidepressant medicines usually are prescribed. Antidepressant medicines are thought to correct chemical imbalances in the brain that are commonly associated with major depressive disorder. Other types of medicine may be added if the symptoms do not respond to antidepressant medicines alone or if psychotic delusions or hallucinations occur. Talk therapy. Talk therapy can be helpful in treating major depressive disorder by providing support, education, and guidance. Certain types of talk therapy also can help with negative thinking (cognitive behavioral therapy) and with relationship issues that trigger this disorder (interpersonal therapy). A mental health specialist can help determine which treatment is best for you. Most people with major depressive disorder do well with a combination of medicine and talk therapy. Treatments involving electrical stimulation of the brain can be used in situations with extremely severe symptoms or when medicine and talk therapy do not work over time. These treatments include electroconvulsive therapy, transcranial magnetic stimulation, and vagal nerve stimulation.   This information is not intended to replace advice given to you by your health care provider. Make sure you discuss any questions you have with your health care provider.   Document Released: 08/24/2012 Document Revised: 05/20/2014 Document Reviewed: 08/24/2012 Elsevier Interactive Patient Education 2016 Elsevier Inc.   Generalized Anxiety Disorder, Adult Generalized anxiety disorder (GAD) is a mental health condition. Unlike normal worries, anxiety related to GAD is not  triggered by a specific event. These worries do not fade or get better with time. GAD interferes with relationships, work, and school. GAD symptoms can vary from mild to severe. People with severe GAD can have intense waves of anxiety with physical symptoms that are similar to panic attacks. What are the causes? The exact cause of GAD is not known, but the following are believed to have an impact: Differences in natural brain chemicals. Genes passed down from parents to children. Differences in the way threats are perceived. Development and stress during childhood. Personality. What increases the risk? The following factors may make you more likely to develop this condition: Being female. Having a family history of anxiety disorders. Being very shy. Experiencing very stressful life events, such as the death of a loved one. Having a very stressful family environment. What are the signs or symptoms? People with GAD often worry excessively about many things in their lives, such as their health and family. Symptoms may also include: Mental and emotional symptoms: Worrying excessively about natural disasters. Fear of being late. Difficulty concentrating. Fears that others are judging your performance. Physical symptoms: Fatigue. Headaches, muscle tension, muscle twitches, trembling, or feeling shaky. Feeling like your heart is pounding or beating very fast. Feeling out of breath or like you cannot take a deep breath. Having trouble falling asleep or staying asleep, or experiencing restlessness. Sweating. Nausea, diarrhea, or irritable bowel syndrome (IBS). Behavioral symptoms: Experiencing erratic moods or irritability. Avoidance of new situations. Avoidance of people. Extreme difficulty making decisions. How is this diagnosed? This condition is diagnosed based on your symptoms and medical history. You will also have a physical exam. Your health care provider may perform tests to rule  out other possible  causes of your symptoms. To be diagnosed with GAD, a person must have anxiety that: Is out of his or her control. Affects several different aspects of his or her life, such as work and relationships. Causes distress that makes him or her unable to take part in normal activities. Includes at least three symptoms of GAD, such as restlessness, fatigue, trouble concentrating, irritability, muscle tension, or sleep problems. Before your health care provider can confirm a diagnosis of GAD, these symptoms must be present more days than they are not, and they must last for 6 months or longer. How is this treated? This condition may be treated with: Medicine. Antidepressant medicine is usually prescribed for long-term daily control. Anti-anxiety medicines may be added in severe cases, especially when panic attacks occur. Talk therapy (psychotherapy). Certain types of talk therapy can be helpful in treating GAD by providing support, education, and guidance. Options include: Cognitive behavioral therapy (CBT). People learn coping skills and self-calming techniques to ease their physical symptoms. They learn to identify unrealistic thoughts and behaviors and to replace them with more appropriate thoughts and behaviors. Acceptance and commitment therapy (ACT). This treatment teaches people how to be mindful as a way to cope with unwanted thoughts and feelings. Biofeedback. This process trains you to manage your body's response (physiological response) through breathing techniques and relaxation methods. You will work with a therapist while machines are used to monitor your physical symptoms. Stress management techniques. These include yoga, meditation, and exercise. A mental health specialist can help determine which treatment is best for you. Some people see improvement with one type of therapy. However, other people require a combination of therapies. Follow these instructions at  home: Lifestyle Maintain a consistent routine and schedule. Anticipate stressful situations. Create a plan and allow extra time to work with your plan. Practice stress management or self-calming techniques that you have learned from your therapist or your health care provider. Exercise regularly and spend time outdoors. Eat a healthy diet that includes plenty of vegetables, fruits, whole grains, low-fat dairy products, and lean protein. Do not eat a lot of foods that are high in fat, added sugar, or salt (sodium). Drink plenty of water. Avoid alcohol. Alcohol can increase anxiety. Avoid caffeine and certain over-the-counter cold medicines. These may make you feel worse. Ask your pharmacist which medicines to avoid. General instructions Take over-the-counter and prescription medicines only as told by your health care provider. Understand that you are likely to have setbacks. Accept this and be kind to yourself as you persist to take better care of yourself. Anticipate stressful situations. Create a plan and allow extra time to work with your plan. Recognize and accept your accomplishments, even if you judge them as small. Spend time with people who care about you. Keep all follow-up visits. This is important. Where to find more information [employer]: http://www.maynard.net/ Substance Abuse and Mental Health Services: [employer].pt Contact a health care provider if: Your symptoms do not get better. Your symptoms get worse. You have signs of depression, such as: A persistently sad or irritable mood. Loss of enjoyment in activities that used to bring you joy. Change in weight or eating. Changes in sleeping habits. Get help right away if: You have thoughts about hurting yourself or others. If you ever feel like you may hurt yourself or others, or have thoughts about taking your own life, get help right away. Go to your nearest emergency department or: Call your local  emergency services (911 in  the [place] Call a suicide crisis helpline, such as the National Suicide Prevention Lifeline at 402-357-26311-307-446-2405 or 988 in the [place] This is open 24 hours a day in the [place] Text the Crisis Text Line at (703)693-2807741741 (in the [place] Summary Generalized anxiety disorder (GAD) is a mental health condition that involves worry that is not triggered by a specific event. People with GAD often worry excessively about many things in their lives, such as their health and family. GAD may cause symptoms such as restlessness, trouble concentrating, sleep problems, frequent sweating, nausea, diarrhea, headaches, and trembling or muscle twitching. A mental health specialist can help determine which treatment is best for you. Some people see improvement with one type of therapy. However, other people require a combination of therapies. This information is not intended to replace advice given to you by your health care provider. Make sure you discuss any questions you have with your health care provider. Document Revised: 11/22/2020 Document Reviewed: 08/20/2020 Elsevier Patient Education  2022 [employer]

## 2021-06-23 LAB — URINALYSIS, ROUTINE W REFLEX MICROSCOPIC
Bilirubin Urine: NEGATIVE
Glucose, UA: NEGATIVE
Hgb urine dipstick: NEGATIVE
Ketones, ur: NEGATIVE
Leukocytes,Ua: NEGATIVE
Nitrite: NEGATIVE
Protein, ur: NEGATIVE
Specific Gravity, Urine: 1.021 (ref 1.001–1.035)
pH: 6 (ref 5.0–8.0)

## 2021-06-23 LAB — URINE CULTURE
MICRO NUMBER:: 12993378
Result:: NO GROWTH
SPECIMEN QUALITY:: ADEQUATE

## 2021-06-25 ENCOUNTER — Encounter: Payer: Self-pay | Admitting: Family

## 2021-06-25 ENCOUNTER — Ambulatory Visit: Admission: RE | Admit: 2021-06-25 | Payer: BC Managed Care – PPO | Source: Ambulatory Visit

## 2021-06-26 ENCOUNTER — Encounter: Payer: Self-pay | Admitting: Urgent Care

## 2021-06-26 ENCOUNTER — Ambulatory Visit
Admission: EM | Admit: 2021-06-26 | Discharge: 2021-06-26 | Disposition: A | Discharge status: Home / Self Care | Payer: BC Managed Care – PPO | Attending: Urgent Care | Admitting: Urgent Care

## 2021-06-26 ENCOUNTER — Other Ambulatory Visit: Payer: Self-pay

## 2021-06-26 DIAGNOSIS — R052 Subacute cough: Secondary | ICD-10-CM | POA: Diagnosis not present

## 2021-06-26 DIAGNOSIS — R1310 Dysphagia, unspecified: Secondary | ICD-10-CM | POA: Diagnosis not present

## 2021-06-26 DIAGNOSIS — B349 Viral infection, unspecified: Secondary | ICD-10-CM | POA: Diagnosis not present

## 2021-06-26 DIAGNOSIS — J309 Allergic rhinitis, unspecified: Secondary | ICD-10-CM | POA: Insufficient documentation

## 2021-06-26 DIAGNOSIS — R000 Tachycardia, unspecified: Secondary | ICD-10-CM | POA: Insufficient documentation

## 2021-06-26 DIAGNOSIS — R070 Pain in throat: Secondary | ICD-10-CM | POA: Diagnosis not present

## 2021-06-26 LAB — CYTOLOGY - PAP
Chlamydia: NEGATIVE
Comment: NEGATIVE
Comment: NEGATIVE
Comment: NORMAL
Diagnosis: NEGATIVE
Neisseria Gonorrhea: NEGATIVE
Trichomonas: NEGATIVE

## 2021-06-26 LAB — POCT RAPID STREP A (OFFICE): Rapid Strep A Screen: NEGATIVE

## 2021-06-26 MED ORDER — PROMETHAZINE-DM 6.25-15 MG/5ML PO SYRP: 5.0000 mL | ORAL_SOLUTION | Freq: Every evening | ORAL | 0 refills | Status: DC | PRN

## 2021-06-26 MED ORDER — PSEUDOEPHEDRINE HCL 30 MG PO TABS: 30.0000 mg | ORAL_TABLET | Freq: Three times a day (TID) | ORAL | 0 refills | Status: DC | PRN

## 2021-06-26 MED ORDER — FEXOFENADINE HCL 180 MG PO TABS: 180.0000 mg | ORAL_TABLET | Freq: Every day | ORAL | 0 refills | Status: DC

## 2021-06-26 NOTE — Discharge Instructions (Signed)
We will notify you of your test results as they arrive and may take between 48-72 hours.  I encourage you to sign up for MyChart if you have not already done so as this can be the easiest way for [place] to communicate results to you online or through a phone app.  Generally, we only contact you if it is a positive test result.  In the meantime, if you develop worsening symptoms including fever, chest pain, shortness of breath despite our current treatment plan then please report to the emergency room as this may be a sign of worsening status from possible viral infection.  Otherwise, we will manage this as a viral syndrome. For sore throat or cough try using a honey-based tea. Use 3 teaspoons of honey with juice squeezed from half lemon. Place shaved pieces of ginger into 1/2-1 cup of water and warm over stove top. Then mix the ingredients and repeat every 4 hours as needed. Please take Tylenol 500mg -650mg  every 6 hours for aches and pains, fevers. Hydrate very well with at least 2 liters of water. Eat light meals such as soups to replenish electrolytes and soft fruits, veggies. Start an antihistamine like Allegra for postnasal drainage, sinus congestion.  You can take this together with pseudoephedrine (Sudafed) at a dose of 30 mg 2-3 times a day as needed for the same kind of congestion.

## 2021-06-26 NOTE — ED Provider Notes (Signed)
Cassandra Huff   MRN: [id number] DOB: Apr 16, 2001  Subjective:   Cassandra Huff is a 21 y.o. female presenting for 1 day history of acute onset throat pain, right sided neck pain, painful swallowing. See white patches on her throat, worse over the right side. Has had a mild cough. No chest pain, shob, fever. Has also had sinus headache, ear pain bilaterally. Has a history of allergic rhinitis, does not take Allegra consistently. No smoking, asthma. No drug use.   No current facility-administered medications for this encounter.  Current Outpatient Medications:    diclofenac (VOLTAREN) 75 MG EC tablet, Take 1 tablet (75 mg total) by mouth 2 (two) times daily., Disp: 60 tablet, Rfl: 1   fexofenadine (ALLEGRA) 180 MG tablet, Take 180 mg by mouth daily. (Patient not taking: Reported on 06/22/2021), Disp: , Rfl:    FLUoxetine (PROZAC) 20 MG capsule, Take 1 capsule (20 mg total) by mouth every morning., Disp: 90 capsule, Rfl: 3   norelgestromin-ethinyl estradiol (XULANE) 150-35 MCG/24HR transdermal patch, APPLY 1 PATCH ONCE A WEEK for 3 weeks, Disp: 9 patch, Rfl: 3   VITAMIN D PO, Take by mouth., Disp: , Rfl:    No Known Allergies  Past Medical History:  Diagnosis Date   Seasonal allergies    UTI (urinary tract infection)    E coli 08/16/20 tx'ed macrobid   Vitamin D deficiency      Past Surgical History:  Procedure Laterality Date   NO PAST SURGERIES      Family History  Problem Relation Age of Onset   Hypertension Father    Breast cancer Neg Hx    Colon cancer Neg Hx     Social History   Tobacco Use   Smoking status: Never   Smokeless tobacco: Never  Vaping Use   Vaping Use: Never used  Substance Use Topics   Alcohol use: No   Drug use: No    ROS   Objective:   Vitals: BP 124/81 (BP Location: Left Arm)    Pulse (!) 105    Temp 98.8 F (37.1 C) (Oral)    Resp 16    SpO2 97%   Physical Exam Constitutional:      General: She is not in acute distress.     Appearance: Normal appearance. She is well-developed and normal weight. She is not ill-appearing, toxic-appearing or diaphoretic.  HENT:     Head: Normocephalic and atraumatic.     Right Ear: Tympanic membrane, ear canal and external ear normal. No drainage or tenderness. No middle ear effusion. There is no impacted cerumen. Tympanic membrane is not erythematous.     Left Ear: Tympanic membrane, ear canal and external ear normal. No drainage or tenderness.  No middle ear effusion. There is no impacted cerumen. Tympanic membrane is not erythematous.     Nose: Nose normal. No congestion or rhinorrhea.     Mouth/Throat:     Mouth: Mucous membranes are moist. No oral lesions.     Pharynx: Posterior oropharyngeal erythema present. No pharyngeal swelling, oropharyngeal exudate or uvula swelling.     Tonsils: No tonsillar exudate or tonsillar abscesses.     Comments: Thick streaks of postnasal drainage overlying pharynx.  A single tonsillith noted over right superior tonsil. Eyes:     General: No scleral icterus.       Right eye: No discharge.        Left eye: No discharge.     Extraocular Movements: Extraocular movements intact.  Right eye: Normal extraocular motion.     Left eye: Normal extraocular motion.     Conjunctiva/sclera: Conjunctivae normal.  Cardiovascular:     Rate and Rhythm: Normal rate.     Heart sounds: No murmur heard.   No friction rub. No gallop.  Pulmonary:     Effort: Pulmonary effort is normal. No respiratory distress.     Breath sounds: No stridor. No wheezing, rhonchi or rales.  Chest:     Chest wall: No tenderness.  Musculoskeletal:     Cervical back: Normal range of motion and neck supple.  Lymphadenopathy:     Cervical: Cervical adenopathy (right superior bordering submandibular) present.  Skin:    General: Skin is warm and dry.  Neurological:     General: No focal deficit present.     Mental Status: She is alert and oriented to person, place, and time.   Psychiatric:        Mood and Affect: Mood normal.        Behavior: Behavior normal.    Results for orders placed or performed during the hospital encounter of 06/26/21 (from the past 24 hour(s))  POCT rapid strep A     Status: None   Collection Time: 06/26/21  1:54 PM  Result Value Ref Range   Rapid Strep A Screen Negative Negative    Assessment and Plan :   PDMP not reviewed this encounter.  1. Throat pain   2. Painful swallowing   3. Subacute cough   4. Allergic rhinitis, unspecified seasonality, unspecified trigger   5. Tachycardia    Suspect that her tachycardia is related to infectious process as opposed to cardiopulmonary event. Deferred imaging given clear cardiopulmonary exam. Will manage for viral illness such as viral URI, viral syndrome, viral rhinitis, COVID-19, viral pharyngitis. Recommended supportive care. Offered scripts for symptomatic relief. Influenza, COVID 19 and strep culture are pending. Counseled patient on potential for adverse effects with medications prescribed/recommended today, ER and return-to-clinic precautions discussed, patient verbalized understanding.     Wallis Bamberg, PA-C 06/26/21 1358

## 2021-06-27 LAB — COVID-19, FLU A+B NAA
Influenza A, NAA: NOT DETECTED
Influenza B, NAA: NOT DETECTED
SARS-CoV-2, NAA: NOT DETECTED

## 2021-06-28 LAB — CULTURE, GROUP A STREP (THRC)

## 2021-08-03 ENCOUNTER — Encounter: Payer: Self-pay | Admitting: Family

## 2021-08-03 ENCOUNTER — Other Ambulatory Visit: Payer: Self-pay

## 2021-08-03 ENCOUNTER — Ambulatory Visit: Payer: BC Managed Care – PPO | Admitting: Family

## 2021-08-03 DIAGNOSIS — R102 Pelvic and perineal pain unspecified side: Secondary | ICD-10-CM

## 2021-08-03 DIAGNOSIS — F419 Anxiety disorder, unspecified: Secondary | ICD-10-CM

## 2021-08-03 DIAGNOSIS — F32A Depression, unspecified: Secondary | ICD-10-CM | POA: Diagnosis not present

## 2021-08-03 NOTE — Patient Instructions (Signed)
So glad to hear you are doing so well.  Please let me know of any concerns or questions as they arise ?

## 2021-08-03 NOTE — Assessment & Plan Note (Signed)
Resolved at this time.  Patient declines seeing urology or having pelvic ultrasound.  She declines further work-up at this time.  will follow ?

## 2021-08-03 NOTE — Assessment & Plan Note (Signed)
Pleased to see improvement.  We will continue Prozac 20 mg.  Patient does not think is necessary to increase dose at this point.  She will let me know how she is doing. ?

## 2021-08-03 NOTE — Progress Notes (Signed)
? ?Subjective:  ? ? Patient ID: Cassandra Huff, female    DOB: June 08, 2000, 21 y.o.   MRN: [id number] ? ?CC: Cassandra Huff is a 21 y.o. female who presents today for follow up.  ? ?HPI: Anxiety and depression has improved.  She feels much more "leveled out".  Pelvic pain has  resolved.  She does have some cramping when she is on her menses.  She has no further concerns. ? ?Started Prozac 20 mg approximately 6 weeks ago. ? ?Pelvic pain-referral to urology has been canceled.   She declined having pelvic ultrasound ?HISTORY:  ?Past Medical History:  ?Diagnosis Date  ? Seasonal allergies   ? UTI (urinary tract infection)   ? E coli 08/16/20 tx'ed macrobid  ? Vitamin D deficiency   ? ?Past Surgical History:  ?Procedure Laterality Date  ? NO PAST SURGERIES    ? ?Family History  ?Problem Relation Age of Onset  ? Hypertension Father   ? Breast cancer Neg Hx   ? Colon cancer Neg Hx   ? ? ?Allergies: Patient has no known allergies. ?Current Outpatient Medications on File Prior to Visit  ?Medication Sig Dispense Refill  ? cetirizine (ZYRTEC) 10 MG tablet Take 10 mg by mouth daily.    ? diclofenac (VOLTAREN) 75 MG EC tablet Take 1 tablet (75 mg total) by mouth 2 (two) times daily. 60 tablet 1  ? FLUoxetine (PROZAC) 20 MG capsule Take 1 capsule (20 mg total) by mouth every morning. 90 capsule 3  ? norelgestromin-ethinyl estradiol (XULANE) 150-35 MCG/24HR transdermal patch APPLY 1 PATCH ONCE A WEEK for 3 weeks 9 patch 3  ? promethazine-dextromethorphan (PROMETHAZINE-DM) 6.25-15 MG/5ML syrup Take 5 mLs by mouth at bedtime as needed for cough. 100 mL 0  ? pseudoephedrine (SUDAFED) 30 MG tablet Take 1 tablet (30 mg total) by mouth every 8 (eight) hours as needed for congestion. 30 tablet 0  ? VITAMIN D PO Take by mouth.    ? ?No current facility-administered medications on file prior to visit.  ? ? ?Social History  ? ?Tobacco Use  ? Smoking status: Never  ? Smokeless tobacco: Never  ?Vaping Use  ? Vaping Use: Never used  ?Substance Use  Topics  ? Alcohol use: No  ? Drug use: No  ? ? ?Review of Systems  ?Constitutional:  Negative for chills and fever.  ?Respiratory:  Negative for cough.   ?Cardiovascular:  Negative for chest pain and palpitations.  ?Gastrointestinal:  Negative for abdominal pain, nausea and vomiting.  ?   ?Objective:  ?  ?BP 110/64 (BP Location: Left Arm, Patient Position: Sitting, Cuff Size: Normal)   Pulse 91   Temp 98.7 ?F (37.1 ?C) (Oral)   Ht 5\' 4"  (1.626 m)   Wt 117 lb (53.1 kg)   SpO2 95%   BMI 20.08 kg/m?  ?BP Readings from Last 3 Encounters:  ?08/03/21 110/64  ?06/26/21 124/81  ?06/22/21 110/64  ? ?Wt Readings from Last 3 Encounters:  ?08/03/21 117 lb (53.1 kg)  ?06/22/21 116 lb 3.2 oz (52.7 kg)  ?05/21/21 117 lb 9.6 oz (53.3 kg)  ? ? ?Physical Exam ?Vitals reviewed.  ?Constitutional:   ?   Appearance: She is well-developed.  ?Eyes:  ?   Conjunctiva/sclera: Conjunctivae normal.  ?Cardiovascular:  ?   Rate and Rhythm: Normal rate and regular rhythm.  ?   Pulses: Normal pulses.  ?   Heart sounds: Normal heart sounds.  ?Pulmonary:  ?   Effort: Pulmonary effort is normal.  ?  Breath sounds: Normal breath sounds. No wheezing, rhonchi or rales.  ?Skin: ?   General: Skin is warm and dry.  ?Neurological:  ?   Mental Status: She is alert.  ?Psychiatric:     ?   Speech: Speech normal.     ?   Behavior: Behavior normal.     ?   Thought Content: Thought content normal.  ? ? ?   ?Assessment & Plan:  ? ?Problem List Items Addressed This Visit   ? ?  ? Other  ? Anxiety and depression  ?  Pleased to see improvement.  We will continue Prozac 20 mg.  Patient does not think is necessary to increase dose at this point.  She will let me know how she is doing. ?  ?  ? Pelvic pain  ?  Resolved at this time.  Patient declines seeing urology or having pelvic ultrasound.  She declines further work-up at this time.  will follow ?  ?  ? ? ? ?I have discontinued Demeisha Illingworth's fexofenadine. I am also having her maintain her VITAMIN D PO, Xulane,  diclofenac, FLUoxetine, pseudoephedrine, promethazine-dextromethorphan, and cetirizine. ? ? ?No orders of the defined types were placed in this encounter. ? ? ?Return precautions given.  ? ?Risks, benefits, and alternatives of the medications and treatment plan prescribed today were discussed, and patient expressed understanding.  ? ?Education regarding symptom management and diagnosis given to patient on AVS. ? ?Continue to follow with [name], FNP for routine health maintenance.  ? ?Kenton Kingfisher and I agreed with plan.  ? ?Rennie Plowman, FNP ? ? ?

## 2021-08-06 ENCOUNTER — Ambulatory Visit: Payer: Self-pay | Admitting: Urology

## 2021-09-14 ENCOUNTER — Other Ambulatory Visit: Payer: Self-pay | Admitting: Advanced Practice Midwife

## 2021-09-14 DIAGNOSIS — Z0000 Encounter for general adult medical examination without abnormal findings: Secondary | ICD-10-CM

## 2021-09-14 DIAGNOSIS — Z3045 Encounter for surveillance of transdermal patch hormonal contraceptive device: Secondary | ICD-10-CM

## 2021-10-06 ENCOUNTER — Other Ambulatory Visit: Payer: Self-pay | Admitting: Advanced Practice Midwife

## 2021-10-06 DIAGNOSIS — Z3045 Encounter for surveillance of transdermal patch hormonal contraceptive device: Secondary | ICD-10-CM

## 2021-10-06 DIAGNOSIS — Z0000 Encounter for general adult medical examination without abnormal findings: Secondary | ICD-10-CM

## 2021-10-24 ENCOUNTER — Other Ambulatory Visit: Payer: Self-pay | Admitting: Advanced Practice Midwife

## 2021-10-24 DIAGNOSIS — Z3045 Encounter for surveillance of transdermal patch hormonal contraceptive device: Secondary | ICD-10-CM

## 2021-10-24 DIAGNOSIS — Z0000 Encounter for general adult medical examination without abnormal findings: Secondary | ICD-10-CM

## 2022-01-16 ENCOUNTER — Encounter: Payer: Self-pay | Admitting: Family

## 2022-02-08 ENCOUNTER — Ambulatory Visit: Payer: BC Managed Care – PPO | Admitting: Family

## 2022-02-22 ENCOUNTER — Ambulatory Visit (INDEPENDENT_AMBULATORY_CARE_PROVIDER_SITE_OTHER): Payer: 59

## 2022-02-22 VITALS — BP 132/83 | HR 83 | Wt 125.0 lb

## 2022-02-22 DIAGNOSIS — Z3201 Encounter for pregnancy test, result positive: Secondary | ICD-10-CM | POA: Diagnosis not present

## 2022-02-22 DIAGNOSIS — N912 Amenorrhea, unspecified: Secondary | ICD-10-CM

## 2022-02-22 DIAGNOSIS — N926 Irregular menstruation, unspecified: Secondary | ICD-10-CM

## 2022-02-22 LAB — POCT URINE PREGNANCY: Preg Test, Ur: POSITIVE — AB

## 2022-02-22 NOTE — Progress Notes (Signed)
Subjective:    Cassandra Huff is a 21 y.o. female who presents for evaluation of amenorrhea. She believes she could be pregnant. Pregnancy is desired.  Last period was normal.   Patient's last menstrual period was 01/19/2022 (approximate).    Lab Review Urine HCG: positive      Plan:    Pregnancy Test:  Positive: EDC: 10/26/2022. Briefly discussed positive results and sent to check out for scheduling for New OB appointments.

## 2022-03-11 ENCOUNTER — Ambulatory Visit (INDEPENDENT_AMBULATORY_CARE_PROVIDER_SITE_OTHER): Payer: 59

## 2022-03-11 VITALS — Wt 123.0 lb

## 2022-03-11 DIAGNOSIS — Z3689 Encounter for other specified antenatal screening: Secondary | ICD-10-CM

## 2022-03-11 DIAGNOSIS — Z3480 Encounter for supervision of other normal pregnancy, unspecified trimester: Secondary | ICD-10-CM | POA: Insufficient documentation

## 2022-03-11 DIAGNOSIS — Z369 Encounter for antenatal screening, unspecified: Secondary | ICD-10-CM

## 2022-03-11 NOTE — Progress Notes (Signed)
New OB Intake  I connected with  Cassandra Huff on 03/11/22 at  1:15 PM EDT by telephone and verified that I am speaking with the correct person using two identifiers. Nurse is located at [employer] and pt is driving.  I explained I am completing New OB Intake today. We discussed her EDD of 10/26/2022 that is based on LMP of approx 9/9.23. Pt is G0/P0. I reviewed her allergies, medications, Medical/Surgical/OB history, and appropriate screenings. Based on history, this is a/an pregnancy uncomplicated .   Patient Active Problem List   Diagnosis Date Noted   Supervision of other normal pregnancy, antepartum 03/11/2022   Urinary frequency 06/22/2021   Cough 05/21/2021   Alternating constipation and diarrhea 08/30/2020   Hematuria 08/30/2020   Vitamin D deficiency 08/11/2020   Other fatigue 04/19/2018   Pelvic pain 04/19/2018   Bloating 04/19/2018   Concentration deficit 02/25/2018   Pain in both feet 02/25/2018   Anxiety and depression 02/23/2018    Concerns addressed today Nausea; adv given per new protocol.  Delivery Plans:  Plans to deliver at [facility].  Anatomy US Explained first scheduled [place] will soon and an anatomy scan will be done at 20 weeks.  Labs  Discussed possible labs to be drawn. Discussed genetic screening with patient. Patient desires genetic testing to be drawn at or after 10 weeks.  COVID Vaccine Patient has not had COVID vaccine.   Social Determinants of Health Food Insecurity: Admits to food insecurity.  Transportation: Patient denies transportation needs. Childcare: Discussed no children allowed at ultrasound appointments.   First visit review I reviewed new OB appt with pt. I explained she will have ob bloodwork and pap smear/pelvic exam if indicated. Explained pt will be seen by Gigi Gin, CNM at first visit; encounter routed to appropriate provider.   Cleophas Dunker, [place] 03/11/2022  1:51 PM

## 2022-03-15 ENCOUNTER — Ambulatory Visit (INDEPENDENT_AMBULATORY_CARE_PROVIDER_SITE_OTHER): Payer: 59

## 2022-03-15 DIAGNOSIS — Z3480 Encounter for supervision of other normal pregnancy, unspecified trimester: Secondary | ICD-10-CM

## 2022-03-15 DIAGNOSIS — Z369 Encounter for antenatal screening, unspecified: Secondary | ICD-10-CM | POA: Diagnosis not present

## 2022-03-15 DIAGNOSIS — Z3A01 Less than 8 weeks gestation of pregnancy: Secondary | ICD-10-CM | POA: Diagnosis not present

## 2022-03-15 DIAGNOSIS — Z3481 Encounter for supervision of other normal pregnancy, first trimester: Secondary | ICD-10-CM | POA: Diagnosis not present

## 2022-03-27 ENCOUNTER — Other Ambulatory Visit: Payer: 59

## 2022-03-27 DIAGNOSIS — Z3480 Encounter for supervision of other normal pregnancy, unspecified trimester: Secondary | ICD-10-CM

## 2022-03-27 DIAGNOSIS — Z369 Encounter for antenatal screening, unspecified: Secondary | ICD-10-CM

## 2022-03-28 LAB — CBC/D/PLT+RPR+RH+ABO+RUBIGG...
Antibody Screen: NEGATIVE
Basophils Absolute: 0 10*3/uL (ref 0.0–0.2)
Basos: 0 %
EOS (ABSOLUTE): 0 10*3/uL (ref 0.0–0.4)
Eos: 0 %
HCV Ab: NONREACTIVE
HIV Screen 4th Generation wRfx: NONREACTIVE
Hematocrit: 38.3 % (ref 34.0–46.6)
Hemoglobin: 13.1 g/dL (ref 11.1–15.9)
Hepatitis B Surface Ag: NEGATIVE
Immature Grans (Abs): 0 10*3/uL (ref 0.0–0.1)
Immature Granulocytes: 0 %
Lymphocytes Absolute: 1.1 10*3/uL (ref 0.7–3.1)
Lymphs: 13 %
MCH: 28.4 pg (ref 26.6–33.0)
MCHC: 34.2 g/dL (ref 31.5–35.7)
MCV: 83 fL (ref 79–97)
Monocytes Absolute: 0.4 10*3/uL (ref 0.1–0.9)
Monocytes: 4 %
Neutrophils Absolute: 6.9 10*3/uL (ref 1.4–7.0)
Neutrophils: 83 %
Platelets: 223 10*3/uL (ref 150–450)
RBC: 4.61 x10E6/uL (ref 3.77–5.28)
RDW: 13.5 % (ref 11.7–15.4)
RPR Ser Ql: NONREACTIVE
Rh Factor: POSITIVE
Rubella Antibodies, IGG: 5.72 index (ref >0.99)
Varicella zoster IgG: <135 index — ABNORMAL LOW (ref >165)
WBC: 8.5 10*3/uL (ref 3.4–10.8)

## 2022-03-28 LAB — HCV INTERPRETATION

## 2022-04-02 ENCOUNTER — Ambulatory Visit (INDEPENDENT_AMBULATORY_CARE_PROVIDER_SITE_OTHER): Payer: 59 | Admitting: Obstetrics

## 2022-04-02 ENCOUNTER — Other Ambulatory Visit (HOSPITAL_COMMUNITY)
Admission: RE | Admit: 2022-04-02 | Discharge: 2022-04-02 | Disposition: A | Discharge status: Home / Self Care | Payer: 59 | Source: Ambulatory Visit | Attending: Obstetrics | Admitting: Obstetrics

## 2022-04-02 VITALS — BP 120/71 | HR 87 | Wt 135.0 lb

## 2022-04-02 DIAGNOSIS — Z113 Encounter for screening for infections with a predominantly sexual mode of transmission: Secondary | ICD-10-CM

## 2022-04-02 DIAGNOSIS — Z1379 Encounter for other screening for genetic and chromosomal anomalies: Secondary | ICD-10-CM | POA: Diagnosis not present

## 2022-04-02 DIAGNOSIS — Z3A10 10 weeks gestation of pregnancy: Secondary | ICD-10-CM

## 2022-04-02 DIAGNOSIS — Z124 Encounter for screening for malignant neoplasm of cervix: Secondary | ICD-10-CM | POA: Diagnosis not present

## 2022-04-02 DIAGNOSIS — Z3480 Encounter for supervision of other normal pregnancy, unspecified trimester: Secondary | ICD-10-CM | POA: Diagnosis not present

## 2022-04-02 DIAGNOSIS — O219 Vomiting of pregnancy, unspecified: Secondary | ICD-10-CM | POA: Diagnosis not present

## 2022-04-02 MED ORDER — ONDANSETRON 4 MG PO TBDP: 4.0000 mg | ORAL_TABLET | Freq: Four times a day (QID) | ORAL | 0 refills | Status: DC | PRN

## 2022-04-02 NOTE — Progress Notes (Signed)
New Obstetric Patient H&P    Chief Complaint: "Desires prenatal care"   History of Present Illness: Patient is a 21 y.o. G1P0000 Not Hispanic or Latino female, LMP 01/19/2022 presents with amenorrhea and positive home pregnancy test. Based on her  LMP, her EDD is Estimated Date of Delivery: 10/26/22 and her EGA is [redacted]w[redacted]d. Cycles are 6. days, regular, and occur approximately every : 28 days. Her last pap smear was not done due to age.    She had a urine pregnancy test which was positive about 4  week(s)  ago. Her last menstrual period was normal and lasted for  5 or 6 day(s). Since her LMP she claims she has experienced nausea. She denies vaginal bleeding. Her past medical history is noncontributory. Her prior pregnancies are notable for none  Since her LMP, she admits to the use of tobacco products  no She claims she has gained   no pounds since the start of her pregnancy.  There are cats in the home in the home  no  She admits close contact with children on a regular basis  no  She has had chicken pox in the past unknown She has had Tuberculosis exposures, symptoms, or previously tested positive for TB   no Current or past history of domestic violence. no  Genetic Screening/Teratology Counseling: (Includes patient, baby's father, or anyone in either family with:)   1. Patient's age >/= 7 at [facility]  no 2. Thalassemia ([place], [place], Mediterranean, or Asian background): MCV<80  no 3. Neural tube defect (meningomyelocele, spina bifida, anencephaly)  no 4. Congenital heart defect  no  5. Down syndrome  no 6. Tay-Sachs (Jewish, [place]  no 7. Canavan's Disease  no 8. Sickle cell disease or trait (African)  no  9. Hemophilia or other blood disorders  no  10. Muscular dystrophy  no  11. Cystic fibrosis  no  12. Huntington's Chorea  no  13. Mental retardation/autism  no 14. Other inherited genetic or chromosomal disorder  no 15. Maternal metabolic disorder (DM, PKU, etc)   no 16. Patient or FOB with a child with a birth defect not listed above no  16a. Patient or FOB with a birth defect themselves no 17. Recurrent pregnancy loss, or stillbirth  no  18. Any medications since LMP other than prenatal vitamins (include vitamins, supplements, OTC meds, drugs, alcohol)  no 19. Any other genetic/environmental exposure to discuss  no  Infection History:   1. Lives with someone with TB or TB exposed  no  2. Patient or partner has history of genital herpes  no 3. Rash or viral illness since LMP  no 4. History of STI (GC, CT, HPV, syphilis, HIV)  no 5. History of recent travel :  no  Other pertinent information:  no     Review of Systems:10 point review of systems negative unless otherwise noted in HPI  Past Medical History:  Past Medical History:  Diagnosis Date   Anxiety    Depression    Seasonal allergies    UTI (urinary tract infection)    E coli 08/16/20 tx'ed macrobid   Vitamin D deficiency     Past Surgical History:  Past Surgical History:  Procedure Laterality Date   NO PAST SURGERIES      Gynecologic History: Patient's last menstrual period was 01/19/2022 (approximate).  Obstetric History: G1P0000  Family History:  Family History  Problem Relation Age of Onset   Hypertension Father  Arthritis Father    Anxiety disorder Sister    Depression Sister    Depression Sister    Anxiety disorder Sister    Arthritis Paternal Grandmother    Cancer Paternal Grandmother 63       kidney   Anxiety disorder Paternal Grandmother    Irritable bowel syndrome Paternal Grandmother    Breast cancer Neg Hx    Colon cancer Neg Hx     Social History:  Social History   Socioeconomic History   Marital status: Single    Spouse name: Not on file   Number of children: 0   Years of education: 14   Highest education level: Not on file  Occupational History   Occupation: customer service  Tobacco Use   Smoking status: Never   Smokeless tobacco:  Never  Vaping Use   Vaping Use: Never used  Substance and Sexual Activity   Alcohol use: Not Currently   Drug use: No   Sexual activity: Yes    Partners: Male    Birth control/protection: None  Other Topics Concern   Not on file  Social History Narrative   At [employer]- senior.    AD in science   Works 2 nights and [employer]    Social Determinants of Health   Financial Resource Strain: Low Risk  (03/11/2022)   Overall Financial Resource Strain (CARDIA)    Difficulty of Paying Living Expenses: Not very hard  Food Insecurity: Food Insecurity Present (03/11/2022)   Hunger Vital Sign    Worried About Running Out of Food in the Last Year: Sometimes true    Ran Out of Food in the Last Year: Never true  Transportation Needs: No Transportation Needs (03/11/2022)   PRAPARE - Administrator, Civil Service (Medical): No    Lack of Transportation (Non-Medical): No  Physical Activity: Insufficiently Active (03/11/2022)   Exercise Vital Sign    Days of Exercise per Week: 4 days    Minutes of Exercise per Session: 30 min  Stress: Stress Concern Present (03/11/2022)   [employer] of Occupational Health - Occupational Stress Questionnaire    Feeling of Stress : To some extent  Social Connections: Moderately Isolated (03/11/2022)   Social Connection and Isolation Panel [NHANES]    Frequency of Communication with Friends and Family: More than three times a week    Frequency of Social Gatherings with Friends and Family: Once a week    Attends Religious Services: Never    Database administrator or Organizations: No    Attends Banker Meetings: Never    Marital Status: Living with partner  Intimate Partner Violence: Not At Risk (03/11/2022)   Humiliation, Afraid, Rape, and Kick questionnaire    Fear of Current or Ex-Partner: No    Emotionally Abused: No    Physically Abused: No    Sexually Abused: No    Allergies:  Allergies  Allergen Reactions   Seasonal Ic  [Octacosanol]     Medications: Prior to Admission medications   Medication Sig Start Date End Date Taking? Authorizing Provider  ondansetron (ZOFRAN-ODT) 4 MG disintegrating tablet Take 1 tablet (4 mg total) by mouth every 6 (six) hours as needed for nausea. 04/02/22  Yes Mirna Mires, CNM  Prenatal Vit-Fe Fumarate-FA (MULTIVITAMIN-PRENATAL) 27-0.8 MG TABS tablet Take 4 tablets by mouth daily at 12 noon.   Yes [provider]  cetirizine (ZYRTEC) 10 MG tablet Take 10 mg by mouth daily. Patient not taking: Reported on 04/02/2022  [provider]  diclofenac (VOLTAREN) 75 MG EC tablet Take 1 tablet (75 mg total) by mouth 2 (two) times daily. Patient not taking: Reported on 03/11/2022 05/29/21   Felecia Shelling, DPM  FLUoxetine (PROZAC) 20 MG capsule Take 1 capsule (20 mg total) by mouth every morning. Patient not taking: Reported on 03/11/2022 06/22/21   [name], FNP  promethazine-dextromethorphan (PROMETHAZINE-DM) 6.25-15 MG/5ML syrup Take 5 mLs by mouth at bedtime as needed for cough. Patient not taking: Reported on 03/11/2022 06/26/21   Wallis Bamberg, PA-C  pseudoephedrine (SUDAFED) 30 MG tablet Take 1 tablet (30 mg total) by mouth every 8 (eight) hours as needed for congestion. Patient not taking: Reported on 04/02/2022 06/26/21   Wallis Bamberg, PA-C  VITAMIN D PO Take by mouth. Patient not taking: Reported on 03/11/2022    [provider]    Physical Exam Vitals: Blood pressure 120/71, pulse 87, weight 135 lb (61.2 kg), last menstrual period 01/19/2022.  General: NAD HEENT: normocephalic, anicteric Thyroid: no enlargement, no palpable nodules Pulmonary: No increased work of breathing, CTAB Cardiovascular: RRR, distal pulses 2+ Abdomen: NABS, soft, non-tender, non-distended.  Umbilicus without lesions.  No hepatomegaly, splenomegaly or masses palpable. No evidence of hernia  Genitourinary:  External: Normal external female genitalia.  Normal  urethral meatus, normal  Bartholin's and Skene's glands.    Vagina: Normal vaginal mucosa, no evidence of prolapse.    Cervix: Grossly normal in appearance, no bleeding  Uterus: anteverted enlarged, mobile, normal contour.  No CMT  Adnexa: ovaries non-enlarged, no adnexal masses  Rectal: deferred Extremities: no edema, erythema, or tenderness Neurologic: Grossly intact Psychiatric: mood appropriate, affect full  FHTS auscultated with doppler today. 174   Assessment: 21 y.o. G1P0000 at [redacted]w[redacted]d presenting to initiate prenatal care  Plan: 1) Avoid alcoholic beverages. 2) Patient encouraged not to smoke.  3) Discontinue the use of all non-medicinal drugs and chemicals.  4) Take prenatal vitamins daily.  5) Nutrition, food safety (fish, cheese advisories, and high nitrite foods) and exercise discussed. 6) Hospital and practice style discussed with cross coverage system.  7) Genetic Screening, such as with 1st Trimester Screening, cell free fetal DNA, AFP testing, and Ultrasound, as well as with amniocentesis and CVS as appropriate, is discussed with patient. At the conclusion of today's visit patient requested although she needs to inquire of her insurance coverage regarding coverage.  8) Patient is asked about travel to areas at risk for the Zika virus, and counseled to avoid travel and exposure to mosquitoes or sexual partners who may have themselves been exposed to the virus. Testing is discussed, and will be ordered as appropriate.  Breastfeeding is discussed today and she is encouraged to take the online [place] class.  RTC in 4 weeks. She understands that an anatomy scan will be done at 18-20 weeks.  I have prescribed her some zofran for her nausea.  Mirna Mires, CNM  04/02/2022 6:43 PM

## 2022-04-02 NOTE — Progress Notes (Signed)
NOB today. Pt needs nausea medication

## 2022-04-04 LAB — CERVICOVAGINAL ANCILLARY ONLY
Bacterial Vaginitis (gardnerella): NEGATIVE
Candida Glabrata: NEGATIVE
Candida Vaginitis: POSITIVE — AB
Chlamydia: NEGATIVE
Comment: NEGATIVE
Comment: NEGATIVE
Comment: NEGATIVE
Comment: NEGATIVE
Comment: NEGATIVE
Comment: NORMAL
Neisseria Gonorrhea: NEGATIVE
Trichomonas: NEGATIVE

## 2022-04-05 ENCOUNTER — Other Ambulatory Visit: Payer: Self-pay | Admitting: Obstetrics

## 2022-04-05 ENCOUNTER — Encounter: Payer: Self-pay | Admitting: Obstetrics

## 2022-04-05 DIAGNOSIS — B3731 Acute candidiasis of vulva and vagina: Secondary | ICD-10-CM

## 2022-04-05 MED ORDER — TERCONAZOLE 0.4 % VA CREA: 1.0000 | TOPICAL_CREAM | Freq: Every day | VAGINAL | 0 refills | Status: DC

## 2022-04-05 NOTE — Progress Notes (Signed)
At NOB, Cassandra Huff's tests swab indicated yeast. [name].  Mirna Mires, CNM  04/05/2022 1:46 AM

## 2022-04-07 LAB — URINE CULTURE

## 2022-04-08 ENCOUNTER — Other Ambulatory Visit: Payer: Self-pay | Admitting: Certified Nurse Midwife

## 2022-04-08 ENCOUNTER — Encounter: Payer: Self-pay | Admitting: Certified Nurse Midwife

## 2022-04-08 MED ORDER — NITROFURANTOIN MONOHYD MACRO 100 MG PO CAPS: 100.0000 mg | ORAL_CAPSULE | Freq: Two times a day (BID) | ORAL | 0 refills | Status: AC

## 2022-04-23 ENCOUNTER — Telehealth: Payer: Self-pay

## 2022-04-23 ENCOUNTER — Other Ambulatory Visit: Payer: Self-pay

## 2022-04-23 MED ORDER — NITROFURANTOIN MONOHYD MACRO 100 MG PO CAPS: 100.0000 mg | ORAL_CAPSULE | Freq: Two times a day (BID) | ORAL | 1 refills | Status: DC

## 2022-04-23 NOTE — Telephone Encounter (Signed)
Pt calling; has just looked at her MyChart and sees where rx was sent in; rx is probably expired?; will rx hurt the baby?  [phone]  Adv pt the rx is good for a year and I have rx'd macrobid for UTI; adv both are okay to take in preg; we are not going to give her something that will harm the baby.

## 2022-04-30 ENCOUNTER — Encounter: Payer: Self-pay | Admitting: Certified Nurse Midwife

## 2022-04-30 ENCOUNTER — Ambulatory Visit (INDEPENDENT_AMBULATORY_CARE_PROVIDER_SITE_OTHER): Payer: 59 | Admitting: Certified Nurse Midwife

## 2022-04-30 VITALS — BP 112/73 | HR 82 | Wt 123.2 lb

## 2022-04-30 DIAGNOSIS — R350 Frequency of micturition: Secondary | ICD-10-CM

## 2022-04-30 DIAGNOSIS — Z3A14 14 weeks gestation of pregnancy: Secondary | ICD-10-CM

## 2022-04-30 LAB — POCT URINALYSIS DIPSTICK OB
Bilirubin, UA: NEGATIVE
Blood, UA: NEGATIVE
Glucose, UA: NEGATIVE
Ketones, UA: NEGATIVE
Leukocytes, UA: NEGATIVE
Nitrite, UA: NEGATIVE
POC,PROTEIN,UA: NEGATIVE
Spec Grav, UA: 1.015 (ref 1.010–1.025)
Urobilinogen, UA: 0.2 E.U./dL
pH, UA: 6 (ref 5.0–8.0)

## 2022-04-30 NOTE — Progress Notes (Addendum)
ROB doing well, not feeling movement yet. Reassurance given. C/o constipation and heart burn. Self help measures reviewed. Medication sheet given. Discussed u/s for anatomy next visit. Follow up 4- 5 wks.   Cassandra Huff, CNM  Urine culture for TOC uti at NOB.

## 2022-04-30 NOTE — Patient Instructions (Signed)
Round Ligament Pain  The round ligaments are a pair of cord-like tissues that help support the uterus. They can become a source of pain during pregnancy as the ligaments soften and stretch as the baby grows. The pain usually begins in the second trimester (13-28 weeks) of pregnancy, and should only last for a few seconds when it occurs. However, the pain can come and go until the baby is delivered. The pain does not cause harm to the baby. Round ligament pain is usually a short, sharp, and pinching pain, but it can also be a dull, lingering, and aching pain. The pain is felt in the lower side of the abdomen or in the groin. It usually starts deep in the groin and moves up to the outside of the hip area. The pain may happen when you: Suddenly change position, such as quickly going from a sitting to standing position. Do physical activity. Cough or sneeze. Follow these instructions at home: Managing pain  When the pain starts, relax. Then, try any of these methods to help with the pain: Sit down. Flex your knees up to your abdomen. Lie on your side with one pillow under your abdomen and another pillow between your legs. Sit in a warm bath for 15-20 minutes or until the pain goes away. General instructions Watch your condition for any changes. Move slowly when you sit down or stand up. Stop or reduce your physical activities if they cause pain. Avoid long walks if they cause pain. Take over-the-counter and prescription medicines only as told by your health care provider. Keep all follow-up visits. This is important. Contact a health care provider if: Your pain does not go away with treatment. You feel pain in your back that you did not have before. Your medicine is not helping. You have a fever or chills. You have nausea or vomiting. You have diarrhea. You have pain when you urinate. Get help right away if: You have pain that is a rhythmic, cramping pain similar to labor pains. Labor  pains are usually 2 minutes apart, last for about 1 minute, and involve a bearing down feeling or pressure in your pelvis. You have vaginal bleeding. These symptoms may represent a serious problem that is an emergency. Do not wait to see if the symptoms will go away. Get medical help right away. Call your local emergency services (911 in the [place] Do not drive yourself to the hospital. Summary Round ligament pain is felt in the lower abdomen or groin. This pain usually begins in the second trimester (13-28 weeks) and should only last for a few seconds when it occurs. You may notice the pain when you suddenly change position, when you cough or sneeze, or during physical activity. Relaxing, flexing your knees to your abdomen, lying on one side, or taking a warm bath may help to get rid of the pain. Contact your health care provider if the pain does not go away. This information is not intended to replace advice given to you by your health care provider. Make sure you discuss any questions you have with your health care provider. Document Revised: 07/12/2020 Document Reviewed: 07/12/2020 Elsevier Patient Education  2023 Elsevier Inc.  

## 2022-05-02 LAB — URINE CULTURE: Organism ID, Bacteria: NO GROWTH

## 2022-05-13 NOTE — L&D Delivery Note (Signed)
Delivery Note   Cassandra Huff is a 22 y.o. G1P0000 at [redacted]w[redacted]d Estimated Date of Delivery: 10/26/22  PRE-OPERATIVE DIAGNOSIS:  1) [redacted]w[redacted]d pregnancy.    POST-OPERATIVE DIAGNOSIS:  1) [redacted]w[redacted]d pregnancy s/p Vaginal, Spontaneous    Delivery Type: Vaginal, Spontaneous    Delivery Anesthesia: Epidural   Labor Complications:  none    ESTIMATED BLOOD LOSS: 200  ml    FINDINGS:   1) female infant, Apgar scores of 9   at 1 minute and 10   at 5 minutes and a birthweight pending per protocol.     SPECIMENS:   PLACENTA:   Appearance: Intact    Removal: Spontaneous      Disposition:  Discarded  CORD BLOOD: Collected/Not Indicated  DISPOSITION:  Infant left in stable condition in the delivery room, with L&D personnel and mother,  NARRATIVE SUMMARY: Labor course:  Cassandra Huff is a G1P0000 at [redacted]w[redacted]d who presented to Labor & Delivery for contractions. She was seen in triage earlier in the day & discharged given unchanged cervix. Her initial cervical exam on return to L&D was 8/C/0. She received an epidural for pain management with good relief. Labor proceeded spontaneously and she was found to be completely dilated at 0203. With excellent maternal pushing effort, she birthed a viable female infant at 55. There  a nuchal cord, somersaulted & reduced after delivery. The shoulders and body followed with ease. The infant was placed skin-to-skin with mother. The cord was doubly clamped and cut by FOB when pulsations ceased. The placenta delivered spontaneously and was noted to be intact with a 3VC. A perineal and vaginal examination was performed. Episiotomy/Lacerations: 2nd degree;Labial  Lacerations were repaired with Vicryl suture using epidural anesthesia. The patient tolerated this well. Mother and baby were left in stable condition.   Dominica Severin, CNM 10/30/2022 2:46 AM

## 2022-06-04 ENCOUNTER — Other Ambulatory Visit: Payer: 59

## 2022-06-04 ENCOUNTER — Ambulatory Visit (INDEPENDENT_AMBULATORY_CARE_PROVIDER_SITE_OTHER): Payer: 59 | Admitting: Obstetrics & Gynecology

## 2022-06-04 VITALS — BP 104/74 | HR 87 | Wt 126.3 lb

## 2022-06-04 DIAGNOSIS — Z3400 Encounter for supervision of normal first pregnancy, unspecified trimester: Secondary | ICD-10-CM

## 2022-06-04 DIAGNOSIS — Z3480 Encounter for supervision of other normal pregnancy, unspecified trimester: Secondary | ICD-10-CM

## 2022-06-04 DIAGNOSIS — Z3482 Encounter for supervision of other normal pregnancy, second trimester: Secondary | ICD-10-CM

## 2022-06-04 DIAGNOSIS — Z3A19 19 weeks gestation of pregnancy: Secondary | ICD-10-CM

## 2022-06-04 LAB — POCT URINALYSIS DIPSTICK OB
Bilirubin, UA: NEGATIVE
Blood, UA: NEGATIVE
Glucose, UA: NEGATIVE
Ketones, UA: NEGATIVE
Leukocytes, UA: NEGATIVE
Nitrite, UA: NEGATIVE
POC,PROTEIN,UA: NEGATIVE
Spec Grav, UA: <=1.005 — AB (ref 1.010–1.025)
Urobilinogen, UA: 0.2 E.U./dL
pH, UA: 7.5 (ref 5.0–8.0)

## 2022-06-04 NOTE — Progress Notes (Signed)
   PRENATAL VISIT NOTE  Subjective:  Cassandra Huff is a 22 y.o. G1P0000 at [redacted]w[redacted]d being seen today for ongoing prenatal care.  She is currently monitored for the following issues for this low-risk pregnancy and has Anxiety and depression; Concentration deficit; Pain in both feet; Other fatigue; Pelvic pain; Bloating; Vitamin D deficiency; Alternating constipation and diarrhea; Hematuria; Cough; Urinary frequency; Supervision of other normal pregnancy, antepartum; and Supervision of normal first pregnancy, antepartum on their problem list.  Patient reports no complaints.  Contractions: Not present. Vag. Bleeding: None.  Movement: Present. Denies leaking of fluid. She is feeling fetal movement.  The following portions of the patient's history were reviewed and updated as appropriate: allergies, current medications, past family history, past medical history, past social history, past surgical history and problem list.   Objective:   Vitals:   06/04/22 1421  BP: 104/74  Pulse: 87  Weight: 126 lb 4.8 oz (57.3 kg)    Fetal Status:     Movement: Present     General:  Alert, oriented and cooperative. Patient is in no acute distress.  Skin: Skin is warm and dry. No rash noted.   Cardiovascular: Normal heart rate noted  Respiratory: Normal respiratory effort, no problems with respiration noted  Abdomen: Soft, gravid, appropriate for gestational age.  Pain/Pressure: Absent     Pelvic:       deferred  Extremities: Normal range of motion.  Edema: Trace  Mental Status: Normal mood and affect. Normal behavior. Normal judgment and thought content.   FHR- 270W FH- at umbilicus  Assessment and Plan:  Pregnancy: G1P0000 at [redacted]w[redacted]d 1. Supervision of other normal pregnancy, antepartum  - POC Urinalysis Dipstick OB  2. Supervision of normal first pregnancy, antepartum - anatomy ultrasound this week - she declines genetic screening  Preterm labor symptoms and general obstetric precautions including  but not limited to vaginal bleeding, contractions, leaking of fluid and fetal movement were reviewed in detail with the patient. Please refer to After Visit Summary for other counseling recommendations.   Return in about 4 weeks (around 07/02/2022).  Future Appointments  Date Time Provider [place]  06/06/2022  4:00 PM [facility]    Emily Filbert, MD

## 2022-06-06 ENCOUNTER — Ambulatory Visit
Admission: RE | Admit: 2022-06-06 | Discharge: 2022-06-06 | Disposition: A | Discharge status: Home / Self Care | Payer: 59 | Source: Ambulatory Visit | Attending: Certified Nurse Midwife | Admitting: Certified Nurse Midwife

## 2022-06-06 DIAGNOSIS — Z3A19 19 weeks gestation of pregnancy: Secondary | ICD-10-CM | POA: Insufficient documentation

## 2022-06-06 DIAGNOSIS — Z363 Encounter for antenatal screening for malformations: Secondary | ICD-10-CM | POA: Insufficient documentation

## 2022-06-06 DIAGNOSIS — Z3A14 14 weeks gestation of pregnancy: Secondary | ICD-10-CM

## 2022-06-10 ENCOUNTER — Telehealth: Payer: Self-pay

## 2022-06-10 NOTE — Telephone Encounter (Signed)
Pt calling; is sick; has her safe meds list; doesn't she need to be seen; what to do?  [phone]  Clarification given regarding safe meds list; sxs are sinuses, nose; sore throat, cough; has chest pain but not SOB; adv if develops SOB with the chest pain to go to ER.

## 2022-07-02 ENCOUNTER — Other Ambulatory Visit (HOSPITAL_COMMUNITY)
Admission: RE | Admit: 2022-07-02 | Discharge: 2022-07-02 | Disposition: A | Discharge status: Home / Self Care | Payer: 59 | Source: Ambulatory Visit | Attending: Obstetrics & Gynecology | Admitting: Obstetrics & Gynecology

## 2022-07-02 ENCOUNTER — Ambulatory Visit (INDEPENDENT_AMBULATORY_CARE_PROVIDER_SITE_OTHER): Payer: 59 | Admitting: Obstetrics & Gynecology

## 2022-07-02 VITALS — BP 100/60 | Wt 127.0 lb

## 2022-07-02 DIAGNOSIS — N898 Other specified noninflammatory disorders of vagina: Secondary | ICD-10-CM

## 2022-07-02 DIAGNOSIS — Z3A23 23 weeks gestation of pregnancy: Secondary | ICD-10-CM

## 2022-07-02 DIAGNOSIS — Z131 Encounter for screening for diabetes mellitus: Secondary | ICD-10-CM

## 2022-07-02 DIAGNOSIS — O99891 Other specified diseases and conditions complicating pregnancy: Secondary | ICD-10-CM

## 2022-07-02 DIAGNOSIS — Z3400 Encounter for supervision of normal first pregnancy, unspecified trimester: Secondary | ICD-10-CM

## 2022-07-02 NOTE — Progress Notes (Signed)
   PRENATAL VISIT NOTE  Subjective:  Cassandra Huff is a 22 y.o. yo G1P0000 at 85w3dbeing seen today for ongoing prenatal care.  She is currently monitored for the following issues for this low-risk pregnancy and has Anxiety and depression; Concentration deficit; Pain in both feet; Other fatigue; Pelvic pain; Bloating; Vitamin D deficiency; Alternating constipation and diarrhea; Hematuria; Cough; Urinary frequency; Supervision of other normal pregnancy, antepartum; and Supervision of normal first pregnancy, antepartum on their problem list.  Patient reports vaginal irritation.  Contractions: Not present. Vag. Bleeding: None.  Movement: Present. Denies leaking of fluid.   The following portions of the patient's history were reviewed and updated as appropriate: allergies, current medications, past family history, past medical history, past social history, past surgical history and problem list.   Objective:   Vitals:   07/02/22 1439  BP: 100/60  Weight: 127 lb (57.6 kg)    Fetal Status:     Movement: Present     General:  Alert, oriented and cooperative. Patient is in no acute distress.  Skin: Skin is warm and dry. No rash noted.   Cardiovascular: Normal heart rate noted  Respiratory: Normal respiratory effort, no problems with respiration noted  Abdomen: Soft, gravid, appropriate for gestational age.  Pain/Pressure: Absent     Pelvic: Cervical exam deferred        Extremities: Normal range of motion.     Mental Status: Normal mood and affect. Normal behavior. Normal judgment and thought content.   Assessment and Plan:  Pregnancy: G1P0000 at 281w3d. Encounter for screening examination for impaired glucose regulation and diabetes mellitus  - 28 Week RH+Panel; Future at next visit  2. Supervision of normal first pregnancy, antepartum   3. [redacted] weeks gestation of pregnancy   4. Vaginal itching  - Cervicovaginal ancillary only - She preferred self- swab  Preterm labor symptoms  and general obstetric precautions including but not limited to vaginal bleeding, contractions, leaking of fluid and fetal movement were reviewed in detail with the patient. Please refer to After Visit Summary for other counseling recommendations.   Return in about 4 weeks (around 07/30/2022) for for ROB and 28 week labs.  No future appointments.  MyEmily FilbertMD

## 2022-07-04 LAB — CERVICOVAGINAL ANCILLARY ONLY
Bacterial Vaginitis (gardnerella): NEGATIVE
Comment: NEGATIVE
Comment: NEGATIVE
Comment: NEGATIVE

## 2022-07-30 ENCOUNTER — Encounter: Payer: 59 | Admitting: Advanced Practice Midwife

## 2022-07-30 ENCOUNTER — Other Ambulatory Visit: Payer: 59

## 2022-08-02 ENCOUNTER — Other Ambulatory Visit: Payer: 59

## 2022-08-02 DIAGNOSIS — Z131 Encounter for screening for diabetes mellitus: Secondary | ICD-10-CM

## 2022-08-03 LAB — 28 WEEK RH+PANEL
Basophils Absolute: 0 10*3/uL (ref 0.0–0.2)
Basos: 0 %
EOS (ABSOLUTE): 0.1 10*3/uL (ref 0.0–0.4)
Eos: 1 %
Gestational Diabetes Screen: 122 mg/dL (ref 70–139)
HIV Screen 4th Generation wRfx: NONREACTIVE
Hematocrit: 34.9 % (ref 34.0–46.6)
Hemoglobin: 11.6 g/dL (ref 11.1–15.9)
Immature Grans (Abs): 0 10*3/uL (ref 0.0–0.1)
Immature Granulocytes: 0 %
Lymphocytes Absolute: 0.8 10*3/uL (ref 0.7–3.1)
Lymphs: 11 %
MCH: 29.9 pg (ref 26.6–33.0)
MCHC: 33.2 g/dL (ref 31.5–35.7)
MCV: 90 fL (ref 79–97)
Monocytes Absolute: 0.3 10*3/uL (ref 0.1–0.9)
Monocytes: 4 %
Neutrophils Absolute: 6.1 10*3/uL (ref 1.4–7.0)
Neutrophils: 84 %
Platelets: 205 10*3/uL (ref 150–450)
RBC: 3.88 x10E6/uL (ref 3.77–5.28)
RDW: 12.2 % (ref 11.7–15.4)
RPR Ser Ql: NONREACTIVE
WBC: 7.2 10*3/uL (ref 3.4–10.8)

## 2022-08-05 ENCOUNTER — Encounter: Payer: Self-pay | Admitting: Obstetrics

## 2022-08-05 ENCOUNTER — Ambulatory Visit (INDEPENDENT_AMBULATORY_CARE_PROVIDER_SITE_OTHER): Payer: 59 | Admitting: Obstetrics

## 2022-08-05 VITALS — BP 103/69 | HR 91 | Wt 129.6 lb

## 2022-08-05 DIAGNOSIS — Z3400 Encounter for supervision of normal first pregnancy, unspecified trimester: Secondary | ICD-10-CM

## 2022-08-05 DIAGNOSIS — Z3A28 28 weeks gestation of pregnancy: Secondary | ICD-10-CM

## 2022-08-05 DIAGNOSIS — Z3403 Encounter for supervision of normal first pregnancy, third trimester: Secondary | ICD-10-CM

## 2022-08-05 LAB — POCT URINALYSIS DIPSTICK OB
Bilirubin, UA: NEGATIVE
Blood, UA: NEGATIVE
Glucose, UA: NEGATIVE
Ketones, UA: NEGATIVE
Nitrite, UA: NEGATIVE
Spec Grav, UA: 1.01 (ref 1.010–1.025)
Urobilinogen, UA: 0.2 E.U./dL
pH, UA: 7.5 (ref 5.0–8.0)

## 2022-08-05 NOTE — Progress Notes (Signed)
ROB [redacted]w[redacted]d: She is doing well. She has good fetal movement. She has no new concerns today. She declined her tdap until next visit. She did sign BTC form today.

## 2022-08-05 NOTE — Progress Notes (Signed)
Routine Prenatal Care Visit  Subjective  Cassandra Huff is a 22 y.o. G1P0000 at [redacted]w[redacted]d being seen today for ongoing prenatal care.  She is currently monitored for the following issues for this low-risk pregnancy and has Anxiety and depression; Concentration deficit; Pain in both feet; Other fatigue; Pelvic pain; Bloating; Vitamin D deficiency; Alternating constipation and diarrhea; Hematuria; Urinary frequency; and Supervision of other normal pregnancy, antepartum on their problem list.  ----------------------------------------------------------------------------------- Patient reports no complaints.  Some stress at home as her BF lost a job, and she is working to provide for them now. Handling anxiety well.she declines tdap.  .  .   Jacklyn Shell Fluid denies.  ----------------------------------------------------------------------------------- The following portions of the patient's history were reviewed and updated as appropriate: allergies, current medications, past family history, past medical history, past social history, past surgical history and problem list. Problem list updated.  Objective  Last menstrual period 01/19/2022. Pregravid weight 124 lb (56.2 kg) Total Weight Gain 3 lb (1.361 kg) Urinalysis: Urine Protein    Urine Glucose    Fetal Status:           General:  Alert, oriented and cooperative. Patient is in no acute distress.  Skin: Skin is warm and dry. No rash noted.   Cardiovascular: Normal heart rate noted  Respiratory: Normal respiratory effort, no problems with respiration noted  Abdomen: Soft, gravid, appropriate for gestational age.       Pelvic:  Cervical exam deferred        Extremities: Normal range of motion.     Mental Status: Normal mood and affect. Normal behavior. Normal judgment and thought content.   Assessment   22 y.o. G1P0000 at [redacted]w[redacted]d by  10/26/2022, by Last Menstrual Period presenting for routine prenatal visit  Plan   first Problems (from 03/11/22 to  present)     No problems associated with this episode.        Preterm labor symptoms and general obstetric precautions including but not limited to vaginal bleeding, contractions, leaking of fluid and fetal movement were reviewed in detail with the patient. Please refer to After Visit Summary for other counseling recommendations.  Reviewed her 28 week labs. Needs to be offered tdap next visit.  No follow-ups on file.  Cassandra Huff, CNM  08/05/2022 9:14 AM

## 2022-08-06 ENCOUNTER — Encounter: Payer: Self-pay | Admitting: Obstetrics

## 2022-08-19 ENCOUNTER — Ambulatory Visit (INDEPENDENT_AMBULATORY_CARE_PROVIDER_SITE_OTHER): Payer: 59 | Admitting: Licensed Practical Nurse

## 2022-08-19 ENCOUNTER — Encounter: Payer: Self-pay | Admitting: Licensed Practical Nurse

## 2022-08-19 VITALS — BP 118/76 | HR 72 | Wt 130.6 lb

## 2022-08-19 DIAGNOSIS — Z3480 Encounter for supervision of other normal pregnancy, unspecified trimester: Secondary | ICD-10-CM

## 2022-08-19 DIAGNOSIS — Z3400 Encounter for supervision of normal first pregnancy, unspecified trimester: Secondary | ICD-10-CM

## 2022-08-19 DIAGNOSIS — Z3A30 30 weeks gestation of pregnancy: Secondary | ICD-10-CM

## 2022-08-19 DIAGNOSIS — Z3483 Encounter for supervision of other normal pregnancy, third trimester: Secondary | ICD-10-CM

## 2022-08-19 DIAGNOSIS — Z23 Encounter for immunization: Secondary | ICD-10-CM

## 2022-08-19 LAB — POCT URINALYSIS DIPSTICK
Bilirubin, UA: NEGATIVE
Blood, UA: NEGATIVE
Glucose, UA: NEGATIVE
Ketones, UA: NEGATIVE
Leukocytes, UA: NEGATIVE
Nitrite, UA: NEGATIVE
Protein, UA: NEGATIVE
Spec Grav, UA: 1.015 (ref 1.010–1.025)
Urobilinogen, UA: 1 E.U./dL
pH, UA: 7 (ref 5.0–8.0)

## 2022-08-19 NOTE — Progress Notes (Signed)
Routine Prenatal Care Visit  Subjective  Cassandra Huff is a 22 y.o. G1P0000 at [redacted]w[redacted]d being seen today for ongoing prenatal care.  She is currently monitored for the following issues for this low-risk pregnancy and has Anxiety and depression; Concentration deficit; Pain in both feet; Other fatigue; Pelvic pain; Bloating; Vitamin D deficiency; Alternating constipation and diarrhea; Hematuria; Urinary frequency; and Supervision of other normal pregnancy, antepartum on their problem list.  ----------------------------------------------------------------------------------- Patient reports fatigue.   Contractions: Not present. Vag. Bleeding: None.  Movement: Present. Leaking Fluid denies.  ----------------------------------------------------------------------------------- The following portions of the patient's history were reviewed and updated as appropriate: allergies, current medications, past family history, past medical history, past social history, past surgical history and problem list. Problem list updated.  Objective  Blood pressure 118/76, pulse 72, weight 130 lb 9.6 oz (59.2 kg), last menstrual period 01/19/2022. Pregravid weight 124 lb (56.2 kg) Total Weight Gain 6 lb 9.6 oz (2.994 kg) Urinalysis: Urine Protein    Urine Glucose    Fetal Status: Fetal Heart Rate (bpm): 140 Fundal Height: 28 cm Movement: Present     General:  Alert, oriented and cooperative. Patient is in no acute distress.  Skin: Skin is warm and dry. No rash noted.   Cardiovascular: Normal heart rate noted  Respiratory: Normal respiratory effort, no problems with respiration noted  Abdomen: Soft, gravid, appropriate for gestational age. Pain/Pressure: Absent     Pelvic:  Cervical exam deferred        Extremities: Normal range of motion.  Edema: None  Mental Status: Normal mood and affect. Normal behavior. Normal judgment and thought content.   Assessment   22 y.o. G1P0000 at [redacted]w[redacted]d by  10/26/2022, by Last Menstrual  Period presenting for routine prenatal visit  Plan   first Problems (from 03/11/22 to present)     No problems associated with this episode.        Preterm labor symptoms and general obstetric precautions including but not limited to vaginal bleeding, contractions, leaking of fluid and fetal movement were reviewed in detail with the patient.  Patient received TDAP today. Reports fatigue; otherwise is feeling well. She is taking childbirth/breastfeeding education classes. Discussed choosing a pediatrician with her today. S/S of preterm labor and preeclampsia discussed today as well as what to do for decreased fetal movement.   Please refer to After Visit Summary for other counseling recommendations.   Patient seen by SNM, Earlie Counts and Carie Caddy, CNM.  Return in about 2 weeks (around 09/02/2022) for ROB.  Carie Caddy, CNM   [facility]  08/19/22  3:48 PM

## 2022-08-20 ENCOUNTER — Telehealth: Payer: Self-pay | Admitting: Obstetrics and Gynecology

## 2022-08-20 NOTE — Telephone Encounter (Signed)
I contacted the patient via phone. I left voicemail for the patient to call back. There was a cancellation for 4/17 at 2:15 with Dr. Valentino Saxon.

## 2022-08-20 NOTE — Telephone Encounter (Signed)
The patient was seen on 4/8 with LMD. The patient no longer needs this appointment

## 2022-09-05 ENCOUNTER — Encounter: Payer: Self-pay | Admitting: Obstetrics and Gynecology

## 2022-09-05 ENCOUNTER — Ambulatory Visit (INDEPENDENT_AMBULATORY_CARE_PROVIDER_SITE_OTHER): Payer: 59 | Admitting: Obstetrics and Gynecology

## 2022-09-05 ENCOUNTER — Other Ambulatory Visit (INDEPENDENT_AMBULATORY_CARE_PROVIDER_SITE_OTHER): Payer: 59

## 2022-09-05 VITALS — BP 110/74 | HR 71 | Wt 135.4 lb

## 2022-09-05 DIAGNOSIS — O26843 Uterine size-date discrepancy, third trimester: Secondary | ICD-10-CM

## 2022-09-05 DIAGNOSIS — Z3400 Encounter for supervision of normal first pregnancy, unspecified trimester: Secondary | ICD-10-CM

## 2022-09-05 DIAGNOSIS — Z3A32 32 weeks gestation of pregnancy: Secondary | ICD-10-CM

## 2022-09-05 LAB — POCT URINALYSIS DIPSTICK OB
Bilirubin, UA: NEGATIVE
Blood, UA: NEGATIVE
Glucose, UA: NEGATIVE
Ketones, UA: NEGATIVE
Leukocytes, UA: NEGATIVE
Nitrite, UA: NEGATIVE
Spec Grav, UA: 1.015 (ref 1.010–1.025)
Urobilinogen, UA: 0.2 E.U./dL
pH, UA: 6.5 (ref 5.0–8.0)

## 2022-09-05 NOTE — Progress Notes (Signed)
ROB: Feels well.  Has no complaints.  Denies contractions.  Taking vitamins.  Fundal height 27.  Size less than dates-ultrasound ordered.

## 2022-09-05 NOTE — Progress Notes (Signed)
ROB. She states daily fetal movement with pelvic pressure. States she is feeling well today. No questions or concerns at this time.

## 2022-09-19 ENCOUNTER — Encounter: Payer: Self-pay | Admitting: Obstetrics

## 2022-09-19 ENCOUNTER — Ambulatory Visit (INDEPENDENT_AMBULATORY_CARE_PROVIDER_SITE_OTHER): Payer: 59 | Admitting: Obstetrics

## 2022-09-19 VITALS — BP 122/76 | HR 70 | Wt 134.7 lb

## 2022-09-19 DIAGNOSIS — Z3403 Encounter for supervision of normal first pregnancy, third trimester: Secondary | ICD-10-CM

## 2022-09-19 DIAGNOSIS — Z3A34 34 weeks gestation of pregnancy: Secondary | ICD-10-CM

## 2022-09-19 DIAGNOSIS — O26843 Uterine size-date discrepancy, third trimester: Secondary | ICD-10-CM

## 2022-09-19 LAB — POCT URINALYSIS DIPSTICK OB
Bilirubin, UA: NEGATIVE
Blood, UA: NEGATIVE
Glucose, UA: NEGATIVE
Ketones, UA: NEGATIVE
Leukocytes, UA: NEGATIVE — AB
Nitrite, UA: NEGATIVE
POC,PROTEIN,UA: NEGATIVE
Spec Grav, UA: 1.01 (ref 1.010–1.025)
Urobilinogen, UA: 0.2 E.U./dL
pH, UA: 6.5 (ref 5.0–8.0)

## 2022-09-19 NOTE — Progress Notes (Signed)
Routine Prenatal Care Visit  Subjective  Cassandra Huff is a 22 y.o. G1P0000 at [redacted]w[redacted]d being seen today for ongoing prenatal care.  She is currently monitored for the following issues for this low-risk pregnancy and has Anxiety and depression; Concentration deficit; Pain in both feet; Other fatigue; Pelvic pain; Bloating; Vitamin D deficiency; Alternating constipation and diarrhea; Hematuria; Urinary frequency; and Supervision of other normal pregnancy, antepartum on their problem list.  ----------------------------------------------------------------------------------- Patient reports no complaints.   Contractions: Not present. Vag. Bleeding: None.  Movement: Present. Leaking Fluid denies.  ----------------------------------------------------------------------------------- The following portions of the patient's history were reviewed and updated as appropriate: allergies, current medications, past family history, past medical history, past social history, past surgical history and problem list. Problem list updated.  Objective  Blood pressure 122/76, pulse 70, weight 134 lb 11.2 oz (61.1 kg), last menstrual period 01/19/2022. Pregravid weight 124 lb (56.2 kg) Total Weight Gain 10 lb 11.2 oz (4.853 kg) Urinalysis: Urine Protein    Urine Glucose    Fetal Status: Fetal Heart Rate (bpm): 137 Fundal Height: 32 cm Movement: Present  Presentation: Vertex  General:  Alert, oriented and cooperative. Patient is in no acute distress.  Skin: Skin is warm and dry. No rash noted.   Cardiovascular: Normal heart rate noted  Respiratory: Normal respiratory effort, no problems with respiration noted  Abdomen: Soft, gravid, appropriate for gestational age. Pain/Pressure: Present     Pelvic:  Cervical exam deferred        Extremities: Normal range of motion.  Edema: None  Mental Status: Normal mood and affect. Normal behavior. Normal judgment and thought content.   Assessment   22 y.o. G1P0000 at [redacted]w[redacted]d by   10/26/2022, by Last Menstrual Period presenting for routine prenatal visit Measures small for dates  Plan   first Problems (from 03/11/22 to present)     No problems associated with this episode.        Preterm labor symptoms and general obstetric precautions including but not limited to vaginal bleeding, contractions, leaking of fluid and fetal movement were reviewed in detail with the patient. Please refer to After Visit Summary for other counseling recommendations.   Return in about 2 weeks (around 10/03/2022) for return OB, repeat growth scan .  Mirna Mires, CNM  09/19/2022 4:04 PM

## 2022-10-03 ENCOUNTER — Encounter: Payer: Self-pay | Admitting: Advanced Practice Midwife

## 2022-10-03 ENCOUNTER — Ambulatory Visit (INDEPENDENT_AMBULATORY_CARE_PROVIDER_SITE_OTHER): Payer: 59 | Admitting: Advanced Practice Midwife

## 2022-10-03 ENCOUNTER — Ambulatory Visit (INDEPENDENT_AMBULATORY_CARE_PROVIDER_SITE_OTHER): Payer: 59

## 2022-10-03 ENCOUNTER — Other Ambulatory Visit (HOSPITAL_COMMUNITY)
Admission: RE | Admit: 2022-10-03 | Discharge: 2022-10-03 | Disposition: A | Discharge status: Home / Self Care | Payer: 59 | Source: Ambulatory Visit | Attending: Advanced Practice Midwife | Admitting: Advanced Practice Midwife

## 2022-10-03 VITALS — BP 125/79 | HR 69 | Wt 134.1 lb

## 2022-10-03 DIAGNOSIS — O26843 Uterine size-date discrepancy, third trimester: Secondary | ICD-10-CM

## 2022-10-03 DIAGNOSIS — Z3403 Encounter for supervision of normal first pregnancy, third trimester: Secondary | ICD-10-CM

## 2022-10-03 DIAGNOSIS — Z113 Encounter for screening for infections with a predominantly sexual mode of transmission: Secondary | ICD-10-CM | POA: Insufficient documentation

## 2022-10-03 DIAGNOSIS — Z3A36 36 weeks gestation of pregnancy: Secondary | ICD-10-CM

## 2022-10-03 DIAGNOSIS — Z3685 Encounter for antenatal screening for Streptococcus B: Secondary | ICD-10-CM

## 2022-10-03 DIAGNOSIS — O365990 Maternal care for other known or suspected poor fetal growth, unspecified trimester, not applicable or unspecified: Secondary | ICD-10-CM

## 2022-10-03 DIAGNOSIS — Z369 Encounter for antenatal screening, unspecified: Secondary | ICD-10-CM

## 2022-10-03 LAB — POCT URINALYSIS DIPSTICK OB
Bilirubin, UA: NEGATIVE
Blood, UA: NEGATIVE
Glucose, UA: NEGATIVE
Ketones, UA: NEGATIVE
Nitrite, UA: NEGATIVE
Spec Grav, UA: 1.01 (ref 1.010–1.025)
Urobilinogen, UA: 0.2 E.U./dL
pH, UA: 6.5 (ref 5.0–8.0)

## 2022-10-03 NOTE — Progress Notes (Signed)
Routine Prenatal Care Visit  Subjective  Cassandra Huff is a 22 y.o. G1P0000 at [redacted]w[redacted]d being seen today for ongoing prenatal care.  She is currently monitored for the following issues for this low-risk pregnancy and has Anxiety and depression; Concentration deficit; Pain in both feet; Other fatigue; Pelvic pain; Bloating; Vitamin D deficiency; Alternating constipation and diarrhea; Hematuria; Urinary frequency; and Supervision of other normal pregnancy, antepartum on their problem list.  ----------------------------------------------------------------------------------- Patient reports  several complaints of third trimester; heartburn, backache, abdominal pains .  Reviewed today's growth scan and recommendations from Dr Valentino Saxon to repeat in a couple weeks. AC increased from 3rd to 7th% since 32 week scan. Overall growth 15.1%. AFI 9.63. She reports some improvement of appetite. She reports having felt sick/nauseated a great part of the pregnancy. Contractions: Not present. Vag. Bleeding: None.  Movement: Increased. Leaking Fluid denies.  ----------------------------------------------------------------------------------- The following portions of the patient's history were reviewed and updated as appropriate: allergies, current medications, past family history, past medical history, past social history, past surgical history and problem list. Problem list updated.  Objective  Blood pressure 125/79, pulse 69, weight 134 lb 1.6 oz (60.8 kg), last menstrual period 01/19/2022. Pregravid weight 124 lb (56.2 kg) Total Weight Gain 10 lb 1.6 oz (4.581 kg) Urinalysis: Urine Protein Trace  Urine Glucose Negative  Fetal Status: Fetal Heart Rate (bpm): 148 Fundal Height: 34 cm Movement: Increased     General:  Alert, oriented and cooperative. Patient is in no acute distress.  Skin: Skin is warm and dry. No rash noted.   Cardiovascular: Normal heart rate noted  Respiratory: Normal respiratory effort, no problems  with respiration noted  Abdomen: Soft, gravid, appropriate for gestational age. Pain/Pressure: Present     Pelvic:  Cervical exam deferred        Extremities: Normal range of motion.  Edema: None  Mental Status: Normal mood and affect. Normal behavior. Normal judgment and thought content.   Assessment   22 y.o. G1P0000 at [redacted]w[redacted]d by  10/26/2022, by Last Menstrual Period presenting for routine prenatal visit  Plan   first Problems (from 03/11/22 to present)    Clinical Staff Provider  Office Location  [facility] Dating  Not found.sono date CW LMP  Language  English Anatomy US  Normal female  Flu Vaccine  Offer  Genetic Screen  NIPS:   TDaP vaccine   08/19/2022 Hgb A1C or  GTT Early : Third trimester : 122  Covid declined   LAB RESULTS   Rhogam  A/Positive/-- (11/15 1422)  Blood Type A/Positive/-- (11/15 1422)   Feeding Plan breast Antibody Negative (11/15 1422)  Contraception undecided Rubella 5.72 (11/15 1422)  Circumcision yes RPR Non Reactive (11/15 1422)   Pediatrician  undecided HBsAg Negative (11/15 1422)   Support Person Braxton HIV Non Reactive (11/15 1422)  Prenatal Classes no Varicella     GBS  (For PCN allergy, check sensitivities)   BTL Consent  Hep C Non Reactive (11/15 1422)   VBAC Consent  Pap Diagnosis  Date Value Ref Range Status  06/22/2021   Final   - Negative for intraepithelial lesion or malignancy (NILM)      Hgb Electro      CF      SMA          Preterm labor symptoms and general obstetric precautions including but not limited to vaginal bleeding, contractions, leaking of fluid and fetal movement were reviewed in detail with the patient. Please refer to After Visit Summary  for other counseling recommendations.   Return in about 1 week (around 10/10/2022) for rob, also needs growth scan in 2 weeks.  Tresea Mall, CNM 10/03/2022 4:34 PM

## 2022-10-07 LAB — CULTURE, BETA STREP (GROUP B ONLY): Strep Gp B Culture: NEGATIVE

## 2022-10-08 LAB — CERVICOVAGINAL ANCILLARY ONLY
Chlamydia: NEGATIVE
Comment: NEGATIVE
Comment: NORMAL
Neisseria Gonorrhea: NEGATIVE

## 2022-10-10 ENCOUNTER — Encounter: Payer: Self-pay | Admitting: Licensed Practical Nurse

## 2022-10-10 ENCOUNTER — Ambulatory Visit (INDEPENDENT_AMBULATORY_CARE_PROVIDER_SITE_OTHER): Payer: 59 | Admitting: Licensed Practical Nurse

## 2022-10-10 VITALS — BP 122/96 | HR 96 | Wt 139.0 lb

## 2022-10-10 DIAGNOSIS — Z3480 Encounter for supervision of other normal pregnancy, unspecified trimester: Secondary | ICD-10-CM

## 2022-10-10 DIAGNOSIS — Z3A37 37 weeks gestation of pregnancy: Secondary | ICD-10-CM

## 2022-10-10 DIAGNOSIS — Z3483 Encounter for supervision of other normal pregnancy, third trimester: Secondary | ICD-10-CM

## 2022-10-10 LAB — POCT URINALYSIS DIPSTICK OB
Bilirubin, UA: NEGATIVE
Blood, UA: NEGATIVE
Glucose, UA: NEGATIVE
Ketones, UA: NEGATIVE
Leukocytes, UA: NEGATIVE
Nitrite, UA: NEGATIVE
POC,PROTEIN,UA: NEGATIVE
Spec Grav, UA: 1.01 (ref 1.010–1.025)
Urobilinogen, UA: 1 E.U./dL
pH, UA: 6.5 (ref 5.0–8.0)

## 2022-10-10 NOTE — Progress Notes (Signed)
Routine Prenatal Care Visit  Subjective  Cassandra Huff is a 22 y.o. G1P0000 at [redacted]w[redacted]d being seen today for ongoing prenatal care.  She is currently monitored for the following issues for this low-risk pregnancy and has Anxiety and depression; Concentration deficit; Pain in both feet; Other fatigue; Pelvic pain; Bloating; Vitamin D deficiency; Alternating constipation and diarrhea; Hematuria; Urinary frequency; and Supervision of other normal pregnancy, antepartum on their problem list.  ----------------------------------------------------------------------------------- Patient reports  cramping .  Denies UTI symptoms, no recent IC, denies changes to her discharge. Last BM yesterday.  -desires epidural, her boyfriend will be her labor support.  -plans to look into pediatricians tomorrow   Contractions: Irritability. Vag. Bleeding: None.  Movement: Present. Leaking Fluid denies.  ----------------------------------------------------------------------------------- The following portions of the patient's history were reviewed and updated as appropriate: allergies, current medications, past family history, past medical history, past social history, past surgical history and problem list. Problem list updated.  Objective  Blood pressure (!) 122/96, pulse 96, weight 139 lb (63 kg), last menstrual period 01/19/2022. Pregravid weight 124 lb (56.2 kg) Total Weight Gain 15 lb (6.804 kg) Urinalysis: Urine Protein Negative  Urine Glucose Negative  Fetal Status: Fetal Heart Rate (bpm): 136 Fundal Height: 36 cm Movement: Present     General:  Alert, oriented and cooperative. Patient is in no acute distress.  Skin: Skin is warm and dry. No rash noted.   Cardiovascular: Normal heart rate noted  Respiratory: Normal respiratory effort, no problems with respiration noted  Abdomen: Soft, gravid, appropriate for gestational age. Pain/Pressure: Present     Pelvic:  Cervical exam deferred        Extremities: Normal  range of motion.  Edema: None  Mental Status: Normal mood and affect. Normal behavior. Normal judgment and thought content.   Assessment   22 y.o. G1P0000 at [redacted]w[redacted]d by  10/26/2022, by Last Menstrual Period presenting for routine prenatal visit  Plan   first Problems (from 03/11/22 to present)     Problem Noted Resolved   Supervision of other normal pregnancy, antepartum 03/11/2022 by Loran Senters, CMA No   Overview Addendum 10/10/2022  8:06 AM by Ellwood Sayers, CNM     Clinical Staff Provider  Office Location   [facility] Dating  sono date CW [facility]  Language  English Anatomy US  Normal female  Flu Vaccine  Offer  Genetic Screen  NIPS: not collected   TDaP vaccine   08/19/2022 Hgb A1C or  GTT Early : Third trimester : 122  Covid declined   LAB RESULTS   Rhogam  A/Positive/-- (11/15 1422)  Blood Type A/Positive/-- (11/15 1422)   Feeding Plan breast Antibody Negative (11/15 1422)  Contraception undecided Rubella 5.72 (11/15 1422)  Circumcision yes RPR Non Reactive (03/22 1029)   Pediatrician  undecided HBsAg Negative (11/15 1422)   Support Person Braxton HIV Non Reactive (03/22 1029)  Prenatal Classes no Varicella Non immune    GBS Negative/-- (05/23 1636)(For PCN allergy, check sensitivities)   BTL Consent  Hep C Non Reactive (11/15 1422)   VBAC Consent  Pap Diagnosis  Date Value Ref Range Status  06/22/2021   Final   - Negative for intraepithelial lesion or malignancy (NILM)      Hgb Electro      CF      SMA                   Term labor symptoms and general obstetric precautions including but not limited to vaginal  bleeding, contractions, leaking of fluid and fetal movement were reviewed in detail with the patient. Please refer to After Visit Summary for other counseling recommendations.   Return in about 1 week (around 10/17/2022) for ROB.  BP elevated today, recheck not done. Will need labs if elevated at next visit.   Carie Caddy, CNM  [facility]  10/10/22  12:18 PM

## 2022-10-15 ENCOUNTER — Ambulatory Visit (INDEPENDENT_AMBULATORY_CARE_PROVIDER_SITE_OTHER): Payer: 59

## 2022-10-15 DIAGNOSIS — Z369 Encounter for antenatal screening, unspecified: Secondary | ICD-10-CM

## 2022-10-15 DIAGNOSIS — Z362 Encounter for other antenatal screening follow-up: Secondary | ICD-10-CM | POA: Diagnosis not present

## 2022-10-15 DIAGNOSIS — O365930 Maternal care for other known or suspected poor fetal growth, third trimester, not applicable or unspecified: Secondary | ICD-10-CM

## 2022-10-15 DIAGNOSIS — Z3A37 37 weeks gestation of pregnancy: Secondary | ICD-10-CM

## 2022-10-15 DIAGNOSIS — Z3403 Encounter for supervision of normal first pregnancy, third trimester: Secondary | ICD-10-CM

## 2022-10-15 DIAGNOSIS — O365990 Maternal care for other known or suspected poor fetal growth, unspecified trimester, not applicable or unspecified: Secondary | ICD-10-CM

## 2022-10-16 ENCOUNTER — Ambulatory Visit (INDEPENDENT_AMBULATORY_CARE_PROVIDER_SITE_OTHER): Payer: 59 | Admitting: Obstetrics

## 2022-10-16 VITALS — BP 113/72 | HR 74 | Wt 138.0 lb

## 2022-10-16 DIAGNOSIS — Z3480 Encounter for supervision of other normal pregnancy, unspecified trimester: Secondary | ICD-10-CM

## 2022-10-16 DIAGNOSIS — Z3A38 38 weeks gestation of pregnancy: Secondary | ICD-10-CM

## 2022-10-16 DIAGNOSIS — Z3483 Encounter for supervision of other normal pregnancy, third trimester: Secondary | ICD-10-CM

## 2022-10-16 LAB — POCT URINALYSIS DIPSTICK OB
Bilirubin, UA: NEGATIVE
Blood, UA: NEGATIVE
Glucose, UA: NEGATIVE
Ketones, UA: NEGATIVE
Nitrite, UA: NEGATIVE
Spec Grav, UA: 1.01 (ref 1.010–1.025)
Urobilinogen, UA: 0.2 E.U./dL
pH, UA: 6 (ref 5.0–8.0)

## 2022-10-16 NOTE — Progress Notes (Signed)
Routine Prenatal Care Visit  Subjective  Cassandra Huff is a 22 y.o. G1P0000 at [redacted]w[redacted]d being seen today for ongoing prenatal care.  She is currently monitored for the following issues for this low-risk pregnancy and has Anxiety and depression; Concentration deficit; Pain in both feet; Other fatigue; Pelvic pain; Bloating; Vitamin D deficiency; Alternating constipation and diarrhea; Hematuria; Urinary frequency; and Supervision of other normal pregnancy, antepartum on their problem list.  ----------------------------------------------------------------------------------- Patient reports no complaints.  She would like to be checked Contractions: Not present. Vag. Bleeding: None.  Movement: Present. Leaking Fluid denies.  ----------------------------------------------------------------------------------- The following portions of the patient's history were reviewed and updated as appropriate: allergies, current medications, past family history, past medical history, past social history, past surgical history and problem list. Problem list updated.  Objective  Blood pressure 113/72, pulse 74, weight 138 lb (62.6 kg), last menstrual period 01/19/2022. Pregravid weight 124 lb (56.2 kg) Total Weight Gain 14 lb (6.35 kg) Urinalysis: Urine Protein Trace  Urine Glucose Negative  Fetal Status: Fetal Heart Rate (bpm): 140 Fundal Height: 34 cm Movement: Present  Presentation: Vertex  General:  Alert, oriented and cooperative. Patient is in no acute distress.  Skin: Skin is warm and dry. No rash noted.   Cardiovascular: Normal heart rate noted  Respiratory: Normal respiratory effort, no problems with respiration noted  Abdomen: Soft, gravid, appropriate for gestational age. Pain/Pressure: Present     Pelvic:  Cervical exam performed Dilation: 1 Effacement (%): 60 Station: -3  Extremities: Normal range of motion.  Edema: None  Mental Status: Normal mood and affect. Normal behavior. Normal judgment and thought  content.   Assessment   22 y.o. G1P0000 at [redacted]w[redacted]d by  10/26/2022, by Last Menstrual Period presenting for routine prenatal visit  Plan   first Problems (from 03/11/22 to present)     Problem Noted Resolved   Supervision of other normal pregnancy, antepartum 03/11/2022 by Loran Senters, CMA No   Overview Addendum 10/16/2022  4:02 PM by Mirna Mires, CNM     Clinical Staff Provider  Office Location  [facility] Dating  sono date CW LLMP  Language  English Anatomy US  Normal female  Flu Vaccine  Offer  Genetic Screen  NIPS: not collected   TDaP vaccine   08/19/2022 Hgb A1C or  GTT Early : Third trimester : 122  Covid declined   LAB RESULTS   Rhogam  A/Positive/-- (11/15 1422)  Blood Type A/Positive/-- (11/15 1422)   Feeding Plan breast Antibody Negative (11/15 1422)  Contraception undecided Rubella 5.72 (11/15 1422)  Circumcision yes RPR Non Reactive (03/22 1029)   Pediatrician  undecided HBsAg Negative (11/15 1422)   Support Person Braxton HIV Non Reactive (03/22 1029)  Prenatal Classes no Varicella Non immune    GBS Negative/-- (05/23 1636)(For PCN allergy, check sensitivities) neg  BTL Consent  Hep C Non Reactive (11/15 1422)   VBAC Consent  Pap Diagnosis  Date Value Ref Range Status  06/22/2021   Final   - Negative for intraepithelial lesion or malignancy (NILM)      Hgb Electro      CF      SMA                   Term labor symptoms and general obstetric precautions including but not limited to vaginal bleeding, contractions, leaking of fluid and fetal movement were reviewed in detail with the patient. Please refer to After Visit Summary for other counseling recommendations.  Return in about 1 week (around 10/23/2022) for return OB.  Mirna Mires, CNM  10/16/2022 4:03 PM

## 2022-10-25 ENCOUNTER — Ambulatory Visit (INDEPENDENT_AMBULATORY_CARE_PROVIDER_SITE_OTHER): Payer: 59 | Admitting: Obstetrics

## 2022-10-25 VITALS — BP 116/78 | HR 85 | Wt 137.0 lb

## 2022-10-25 DIAGNOSIS — Z3A39 39 weeks gestation of pregnancy: Secondary | ICD-10-CM

## 2022-10-25 DIAGNOSIS — Z3480 Encounter for supervision of other normal pregnancy, unspecified trimester: Secondary | ICD-10-CM

## 2022-10-25 DIAGNOSIS — Z3483 Encounter for supervision of other normal pregnancy, third trimester: Secondary | ICD-10-CM

## 2022-10-25 LAB — POCT URINALYSIS DIPSTICK OB
Bilirubin, UA: NEGATIVE
Blood, UA: NEGATIVE
Glucose, UA: NEGATIVE
Ketones, UA: NEGATIVE
Nitrite, UA: NEGATIVE
Urobilinogen, UA: 0.2 E.U./dL
pH, UA: 6.5 (ref 5.0–8.0)

## 2022-10-25 NOTE — Progress Notes (Signed)
Routine Prenatal Care Visit  Subjective  Cassandra Huff is a 22 y.o. G1P0000 at [redacted]w[redacted]d being seen today for ongoing prenatal care.  She is currently monitored for the following issues for this low-risk pregnancy and has Anxiety and depression; Concentration deficit; Pain in both feet; Other fatigue; Pelvic pain; Bloating; Vitamin D deficiency; Alternating constipation and diarrhea; Hematuria; Urinary frequency; and Supervision of other normal pregnancy, antepartum on their problem list.  ----------------------------------------------------------------------------------- Patient reports no complaints.  She has been cramping. Very ready for labor Contractions: Not present. Vag. Bleeding: None.  Movement: Present. Leaking Fluid denies.  ----------------------------------------------------------------------------------- The following portions of the patient's history were reviewed and updated as appropriate: allergies, current medications, past family history, past medical history, past social history, past surgical history and problem list. Problem list updated.  Objective  Blood pressure 116/78, pulse 85, weight 137 lb (62.1 kg), last menstrual period 01/19/2022. Pregravid weight 124 lb (56.2 kg) Total Weight Gain 13 lb (5.897 kg) Urinalysis: Urine Protein    Urine Glucose    Fetal Status:     Movement: Present     General:  Alert, oriented and cooperative. Patient is in no acute distress.  Skin: Skin is warm and dry. No rash noted.   Cardiovascular: Normal heart rate noted  Respiratory: Normal respiratory effort, no problems with respiration noted  Abdomen: Soft, gravid, appropriate for gestational age. Pain/Pressure: Present     Pelvic:   1.5, 70%/-2, cervix is ripe. Sweep performed with her permission.         Extremities: Normal range of motion.     Mental Status: Normal mood and affect. Normal behavior. Normal judgment and thought content.   Assessment   22 y.o. G1P0000 at [redacted]w[redacted]d by   10/26/2022, by Last Menstrual Period presenting for routine prenatal visit  Plan   first Problems (from 03/11/22 to present)     Problem Noted Resolved   Supervision of other normal pregnancy, antepartum 03/11/2022 by Loran Senters, CMA No   Overview Addendum 10/16/2022  4:02 PM by Mirna Mires, CNM     Clinical Staff Provider  Office Location  [facility] Dating  sono date CW LLMP  Language  English Anatomy US  Normal female  Flu Vaccine  Offer  Genetic Screen  NIPS: not collected   TDaP vaccine   08/19/2022 Hgb A1C or  GTT Early : Third trimester : 122  Covid declined   LAB RESULTS   Rhogam  A/Positive/-- (11/15 1422)  Blood Type A/Positive/-- (11/15 1422)   Feeding Plan breast Antibody Negative (11/15 1422)  Contraception undecided Rubella 5.72 (11/15 1422)  Circumcision yes RPR Non Reactive (03/22 1029)   Pediatrician  undecided HBsAg Negative (11/15 1422)   Support Person Braxton HIV Non Reactive (03/22 1029)  Prenatal Classes no Varicella Non immune    GBS Negative/-- (05/23 1636)(For PCN allergy, check sensitivities) neg  BTL Consent  Hep C Non Reactive (11/15 1422)   VBAC Consent  Pap Diagnosis  Date Value Ref Range Status  06/22/2021   Final   - Negative for intraepithelial lesion or malignancy (NILM)      Hgb Electro      CF      SMA                   Term labor symptoms and general obstetric precautions including but not limited to vaginal bleeding, contractions, leaking of fluid and fetal movement were reviewed in detail with the patient. Please refer to After Visit  Summary for other counseling recommendations.   Return in about 1 week (around 11/01/2022) for return OB, check on IOL at 41 weeks.Mirna Mires, CNM  10/25/2022 11:20 AM

## 2022-10-28 ENCOUNTER — Ambulatory Visit (INDEPENDENT_AMBULATORY_CARE_PROVIDER_SITE_OTHER): Payer: 59

## 2022-10-28 ENCOUNTER — Ambulatory Visit (INDEPENDENT_AMBULATORY_CARE_PROVIDER_SITE_OTHER): Payer: 59 | Admitting: Obstetrics

## 2022-10-28 VITALS — BP 125/89 | HR 74 | Wt 137.2 lb

## 2022-10-28 VITALS — BP 125/89 | HR 74 | Ht 64.0 in | Wt 137.2 lb

## 2022-10-28 DIAGNOSIS — Z3A40 40 weeks gestation of pregnancy: Secondary | ICD-10-CM | POA: Diagnosis not present

## 2022-10-28 DIAGNOSIS — O480 Post-term pregnancy: Secondary | ICD-10-CM

## 2022-10-28 DIAGNOSIS — Z3403 Encounter for supervision of normal first pregnancy, third trimester: Secondary | ICD-10-CM

## 2022-10-28 LAB — POCT URINALYSIS DIPSTICK OB
Bilirubin, UA: NEGATIVE
Blood, UA: NEGATIVE
Glucose, UA: NEGATIVE
Ketones, UA: NEGATIVE
Nitrite, UA: NEGATIVE
POC,PROTEIN,UA: NEGATIVE
Spec Grav, UA: 1.01 (ref 1.010–1.025)
Urobilinogen, UA: 0.2 E.U./dL
pH, UA: 6 (ref 5.0–8.0)

## 2022-10-28 NOTE — Progress Notes (Signed)
Routine Prenatal Care Visit  Subjective  Cassandra Huff is a 22 y.o. G1P0000 at [redacted]w[redacted]d being seen today for ongoing prenatal care.  She is currently monitored for the following issues for this low-risk pregnancy and has Anxiety and depression; Concentration deficit; Pain in both feet; Other fatigue; Pelvic pain; Bloating; Vitamin D deficiency; Alternating constipation and diarrhea; Hematuria; Urinary frequency; and Supervision of other normal pregnancy, antepartum on their problem list.  ----------------------------------------------------------------------------------- Patient reports no complaints.  She did have much cramping after last visit. No regular contractions. Contractions: Irregular. Vag. Bleeding: None.  Movement: Present. Leaking Fluid denies.  ----------------------------------------------------------------------------------- The following portions of the patient's history were reviewed and updated as appropriate: allergies, current medications, past family history, past medical history, past social history, past surgical history and problem list. Problem list updated.  Objective  Blood pressure 125/89, pulse 74, weight 137 lb 3.2 oz (62.2 kg), last menstrual period 01/19/2022. Pregravid weight 124 lb (56.2 kg) Total Weight Gain 13 lb 3.2 oz (5.987 kg) Urinalysis: Urine Protein    Urine Glucose    Fetal Status:     Movement: Present     General:  Alert, oriented and cooperative. Patient is in no acute distress.  Skin: Skin is warm and dry. No rash noted.   Cardiovascular: Normal heart rate noted  Respiratory: Normal respiratory effort, no problems with respiration noted  Abdomen: Soft, gravid, appropriate for gestational age. Pain/Pressure: Present     Pelvic:   Tight 2 cms/705/-2, cervix is swept, soft         Extremities: Normal range of motion.  Edema: None  Mental Status: Normal mood and affect. Normal behavior. Normal judgment and thought content.   Assessment   22 y.o.  G1P0000 at [redacted]w[redacted]d by  10/26/2022, by Last Menstrual Period presenting for routine prenatal visit  Plan   first Problems (from 03/11/22 to present)     Problem Noted Resolved   Supervision of other normal pregnancy, antepartum 03/11/2022 by Loran Senters, CMA No   Overview Addendum 10/16/2022  4:02 PM by Mirna Mires, CNM     Clinical Staff Provider  Office Location  [facility] Dating  sono date CW LLMP  Language  English Anatomy US  Normal female  Flu Vaccine  Offer  Genetic Screen  NIPS: not collected   TDaP vaccine   08/19/2022 Hgb A1C or  GTT Early : Third trimester : 122  Covid declined   LAB RESULTS   Rhogam  A/Positive/-- (11/15 1422)  Blood Type A/Positive/-- (11/15 1422)   Feeding Plan breast Antibody Negative (11/15 1422)  Contraception undecided Rubella 5.72 (11/15 1422)  Circumcision yes RPR Non Reactive (03/22 1029)   Pediatrician  undecided HBsAg Negative (11/15 1422)   Support Person Braxton HIV Non Reactive (03/22 1029)  Prenatal Classes no Varicella Non immune    GBS Negative/-- (05/23 1636)(For PCN allergy, check sensitivities) neg  BTL Consent  Hep C Non Reactive (11/15 1422)   VBAC Consent  Pap Diagnosis  Date Value Ref Range Status  06/22/2021   Final   - Negative for intraepithelial lesion or malignancy (NILM)      Hgb Electro      CF      SMA                   Term labor symptoms and general obstetric precautions including but not limited to vaginal bleeding, contractions, leaking of fluid and fetal movement were reviewed in detail with the patient. Please refer  to After Visit Summary for other counseling recommendations.  Discussed the rec for IOL at 41 weeks. She desires this. Set up for Saturday, 11/02/2022 at 0800.form faxed in to the unit. Discussed foley vs pitocin and AROM. No follow-ups on file.  Mirna Mires, CNM  10/28/2022 11:06 AM

## 2022-10-28 NOTE — Patient Instructions (Signed)

## 2022-10-28 NOTE — Progress Notes (Signed)
    NURSE VISIT NOTE  Subjective:    Patient ID: Cassandra Huff, female    DOB: [birth year], 22 y.o.   MRN: [id number]  HPI  Patient is a 22 y.o. G21P0000 female who presents for fetal monitoring per order from Paula Compton, CNM.   Objective:    BP 125/89   Pulse 74   Ht 5\' 4"  (1.626 m)   Wt 137 lb 3.2 oz (62.2 kg)   LMP 01/19/2022 (Approximate)   BMI 23.55 kg/m  Estimated Date of Delivery: 10/26/22  [redacted]w[redacted]d  Fetus A Non-Stress Test Interpretation for 10/28/22  Indication: Post Dates  Fetal Heart Rate A Mode: External Baseline Rate (A): 135 bpm Variability: Moderate Accelerations: 15 x 15 Decelerations: None Multiple birth?: No  Uterine Activity Mode: Toco Contraction Frequency (min): rare Contraction Duration (sec): 40 Resting Time: Adequate  Interpretation (Fetal Testing) Nonstress Test Interpretation: Reactive Overall Impression: Reassuring for gestational age   Assessment:   1. Post-term pregnancy, 40-42 weeks of gestation   2. [redacted] weeks gestation of pregnancy      Plan:   Results reviewed and discussed with patient by  Paula Compton, CNM.     Rocco Serene, LPN

## 2022-10-29 ENCOUNTER — Inpatient Hospital Stay
Admission: EM | Admit: 2022-10-29 | Discharge: 2022-10-31 | DRG: 807 | Disposition: A | Discharge status: Home / Self Care | Payer: 59 | Attending: Certified Nurse Midwife | Admitting: Certified Nurse Midwife

## 2022-10-29 ENCOUNTER — Encounter: Payer: Self-pay | Admitting: Certified Nurse Midwife

## 2022-10-29 ENCOUNTER — Inpatient Hospital Stay: Payer: 59 | Admitting: General Practice

## 2022-10-29 ENCOUNTER — Encounter: Payer: Self-pay | Admitting: Obstetrics and Gynecology

## 2022-10-29 ENCOUNTER — Other Ambulatory Visit: Payer: Self-pay

## 2022-10-29 ENCOUNTER — Inpatient Hospital Stay (EMERGENCY_DEPARTMENT_HOSPITAL)
Admission: EM | Admit: 2022-10-29 | Discharge: 2022-10-29 | Disposition: A | Discharge status: Home / Self Care | Payer: 59 | Source: Home / Self Care | Admitting: Obstetrics and Gynecology

## 2022-10-29 DIAGNOSIS — Z23 Encounter for immunization: Secondary | ICD-10-CM | POA: Diagnosis not present

## 2022-10-29 DIAGNOSIS — O26893 Other specified pregnancy related conditions, third trimester: Secondary | ICD-10-CM

## 2022-10-29 DIAGNOSIS — Z3480 Encounter for supervision of other normal pregnancy, unspecified trimester: Principal | ICD-10-CM

## 2022-10-29 DIAGNOSIS — R109 Unspecified abdominal pain: Secondary | ICD-10-CM

## 2022-10-29 DIAGNOSIS — O2610 Low weight gain in pregnancy, unspecified trimester: Secondary | ICD-10-CM | POA: Diagnosis present

## 2022-10-29 DIAGNOSIS — Z3A40 40 weeks gestation of pregnancy: Secondary | ICD-10-CM

## 2022-10-29 DIAGNOSIS — O2613 Low weight gain in pregnancy, third trimester: Secondary | ICD-10-CM | POA: Diagnosis not present

## 2022-10-29 DIAGNOSIS — O480 Post-term pregnancy: Secondary | ICD-10-CM

## 2022-10-29 DIAGNOSIS — O471 False labor at or after 37 completed weeks of gestation: Secondary | ICD-10-CM | POA: Insufficient documentation

## 2022-10-29 LAB — CBC
HCT: 34.8 % — ABNORMAL LOW (ref 36.0–46.0)
Hemoglobin: 12 g/dL (ref 12.0–15.0)
MCH: 27.6 pg (ref 26.0–34.0)
MCHC: 34.5 g/dL (ref 30.0–36.0)
MCV: 80.2 fL (ref 80.0–100.0)
Platelets: 278 10*3/uL (ref 150–400)
RBC: 4.34 MIL/uL (ref 3.87–5.11)
RDW: 12.7 % (ref 11.5–15.5)
WBC: 13 10*3/uL — ABNORMAL HIGH (ref 4.0–10.5)
nRBC: 0 % (ref 0.0–0.2)

## 2022-10-29 MED ORDER — MISOPROSTOL 200 MCG PO TABS
ORAL_TABLET | ORAL | Status: AC
  Filled 2022-10-29: qty 4

## 2022-10-29 MED ORDER — LIDOCAINE HCL (PF) 1 % IJ SOLN
30.0000 mL | INTRAMUSCULAR | Status: DC | PRN
  Filled 2022-10-29: qty 30

## 2022-10-29 MED ORDER — LACTATED RINGERS IV SOLN: 500.0000 mL | INTRAVENOUS | Status: DC | PRN

## 2022-10-29 MED ORDER — ONDANSETRON HCL 4 MG/2ML IJ SOLN
4.0000 mg | Freq: Four times a day (QID) | INTRAMUSCULAR | Status: DC | PRN
  Administered 2022-10-30: 4 mg via INTRAVENOUS
  Filled 2022-10-29: qty 2

## 2022-10-29 MED ORDER — OXYTOCIN 10 UNIT/ML IJ SOLN
INTRAMUSCULAR | Status: AC
  Filled 2022-10-29: qty 2

## 2022-10-29 MED ORDER — FLEET ENEMA 7-19 GM/118ML RE ENEM: 1.0000 | ENEMA | RECTAL | Status: DC | PRN

## 2022-10-29 MED ORDER — LIDOCAINE HCL (PF) 1 % IJ SOLN
INTRAMUSCULAR | Status: AC
  Filled 2022-10-29: qty 30

## 2022-10-29 MED ORDER — EPHEDRINE 5 MG/ML INJ: 10.0000 mg | INTRAVENOUS | Status: DC | PRN

## 2022-10-29 MED ORDER — SODIUM CHLORIDE 0.9 % IV SOLN
INTRAVENOUS | Status: DC | PRN
  Administered 2022-10-29: 5 mL via EPIDURAL

## 2022-10-29 MED ORDER — LACTATED RINGERS IV SOLN: 500.0000 mL | Freq: Once | INTRAVENOUS | Status: DC

## 2022-10-29 MED ORDER — FENTANYL-BUPIVACAINE-NACL 0.5-0.125-0.9 MG/250ML-% EP SOLN
EPIDURAL | Status: AC
  Filled 2022-10-29: qty 250

## 2022-10-29 MED ORDER — FENTANYL-BUPIVACAINE-NACL 0.5-0.125-0.9 MG/250ML-% EP SOLN
12.0000 mL/h | EPIDURAL | Status: DC | PRN
  Administered 2022-10-29: 12 mL/h via EPIDURAL

## 2022-10-29 MED ORDER — PHENYLEPHRINE 80 MCG/ML (10ML) SYRINGE FOR IV PUSH (FOR BLOOD PRESSURE SUPPORT): 80.0000 ug | PREFILLED_SYRINGE | INTRAVENOUS | Status: DC | PRN

## 2022-10-29 MED ORDER — OXYTOCIN-SODIUM CHLORIDE 30-0.9 UT/500ML-% IV SOLN
INTRAVENOUS | Status: AC
  Filled 2022-10-29: qty 500

## 2022-10-29 MED ORDER — OXYTOCIN-SODIUM CHLORIDE 30-0.9 UT/500ML-% IV SOLN
2.5000 [IU]/h | INTRAVENOUS | Status: DC
  Administered 2022-10-30: 2.5 [IU]/h via INTRAVENOUS
  Filled 2022-10-29: qty 500

## 2022-10-29 MED ORDER — AMMONIA AROMATIC IN INHA
RESPIRATORY_TRACT | Status: AC
  Filled 2022-10-29: qty 10

## 2022-10-29 MED ORDER — LACTATED RINGERS IV SOLN: INTRAVENOUS | Status: DC

## 2022-10-29 MED ORDER — SOD CITRATE-CITRIC ACID 500-334 MG/5ML PO SOLN: 30.0000 mL | ORAL | Status: DC | PRN

## 2022-10-29 MED ORDER — ACETAMINOPHEN 325 MG PO TABS: 650.0000 mg | ORAL_TABLET | ORAL | Status: DC | PRN

## 2022-10-29 MED ORDER — LIDOCAINE-EPINEPHRINE (PF) 1.5 %-1:200000 IJ SOLN
INTRAMUSCULAR | Status: DC | PRN
  Administered 2022-10-29: 3 mL via PERINEURAL

## 2022-10-29 MED ORDER — DIPHENHYDRAMINE HCL 50 MG/ML IJ SOLN: 12.5000 mg | INTRAMUSCULAR | Status: DC | PRN

## 2022-10-29 MED ORDER — OXYTOCIN BOLUS FROM INFUSION
333.0000 mL | Freq: Once | INTRAVENOUS | Status: AC
  Administered 2022-10-30: 333 mL via INTRAVENOUS

## 2022-10-29 NOTE — Anesthesia Procedure Notes (Signed)
Epidural Patient location during procedure: OB Start time: 10/29/2022 11:25 PM End time: 10/29/2022 11:43 PM  Staffing Anesthesiologist: Stephanie Coup, MD Performed: anesthesiologist   Preanesthetic Checklist Completed: patient identified, IV checked, site marked, risks and benefits discussed, surgical consent, monitors and equipment checked, pre-op evaluation and timeout performed  Epidural Patient position: sitting Prep: Betadine Patient monitoring: heart rate, continuous pulse ox and blood pressure Approach: midline Location: L3-L4 Injection technique: LOR saline  Needle:  Needle type: Tuohy  Needle gauge: 18 G Needle length: 9 cm and 9 Needle insertion depth: 4 cm Catheter type: closed end flexible Catheter size: 20 Guage Catheter at skin depth: 9 cm Test dose: negative and 1.5% lidocaine with Epi 1:200 K  Assessment Sensory level: T4 Events: blood not aspirated, no cerebrospinal fluid, injection not painful, no injection resistance, no paresthesia and negative IV test  Additional Notes 1 attempt Pt. Evaluated and documentation done after procedure finished. Patient identified. Risks/Benefits/Options discussed with patient including but not limited to bleeding, infection, nerve damage, paralysis, failed block, incomplete pain control, headache, blood pressure changes, nausea, vomiting, reactions to medication both or allergic, itching and postpartum back pain. Confirmed with bedside nurse the patient's most recent platelet count. Confirmed with patient that they are not currently taking any anticoagulation, have any bleeding history or any family history of bleeding disorders. Patient expressed understanding and wished to proceed. All questions were answered. Sterile technique was used throughout the entire procedure. Please see nursing notes for vital signs. Test dose was given through epidural catheter and negative prior to continuing to dose epidural or start infusion.  Warning signs of high block given to the patient including shortness of breath, tingling/numbness in hands, complete motor block, or any concerning symptoms with instructions to call for help. Patient was given instructions on fall risk and not to get out of bed. All questions and concerns addressed with instructions to call with any issues or inadequate analgesia.    Patient tolerated the insertion well without immediate complications. Reason for block:procedure for pain

## 2022-10-29 NOTE — H&P (Signed)
History and Physical   HPI  Cassandra Huff is a 22 y.o. G1P0000 at [redacted]w[redacted]d Estimated Date of Delivery: 10/26/22 who is being admitted for labor management at term. She has been contracting since 4am. She was seen in triage earlier today & discharged home in latent labor. She returned with more frequent & painful contractions. Endorses fetal movement, denies loss of fluid or vaginal bleeding.  Pregnancy complicated by anxiety/depression, no medications.   OB History  OB History  Gravida Para Term Preterm AB Living  1 0 0 0 0 0  SAB IAB Ectopic Multiple Live Births  0 0 0 0 0    # Outcome Date GA Lbr Len/2nd Weight Sex Delivery Anes PTL Lv  1 Current             PROBLEM LIST  Pregnancy complications or risks: Patient Active Problem List   Diagnosis Date Noted   Labor and delivery indication for care or intervention 10/29/2022   Supervision of other normal pregnancy, antepartum 03/11/2022   Urinary frequency 06/22/2021   Alternating constipation and diarrhea 08/30/2020   Hematuria 08/30/2020   Vitamin D deficiency 08/11/2020   Other fatigue 04/19/2018   Pelvic pain 04/19/2018   Bloating 04/19/2018   Concentration deficit 02/25/2018   Pain in both feet 02/25/2018   Anxiety and depression 02/23/2018    Prenatal labs and studies: ABO, Rh: A/Positive/-- (11/15 1422) Antibody: Negative (11/15 1422) Rubella: 5.72 (11/15 1422) RPR: Non Reactive (03/22 1029)  HBsAg: Negative (11/15 1422)  HIV: Non Reactive (03/22 1029)  ZOX:WRUEAVWU/-- (05/23 1636)   Past Medical History:  Diagnosis Date   Anxiety    Cough 05/21/2021   Depression    Seasonal allergies    UTI (urinary tract infection)    E coli 08/16/20 tx'ed macrobid   Vitamin D deficiency      Past Surgical History:  Procedure Laterality Date   NO PAST SURGERIES       Medications    Current Discharge Medication List     CONTINUE these medications which have NOT CHANGED   Details  doxylamine, Sleep, (UNISOM)  25 MG tablet Take 25 mg by mouth at bedtime as needed.    fluticasone (FLONASE) 50 MCG/ACT nasal spray Place 1 spray into both nostrils daily.    Prenatal Vit-Fe Fumarate-FA (MULTIVITAMIN-PRENATAL) 27-0.8 MG TABS tablet Take 4 tablets by mouth daily at 12 noon.         Allergies  Seasonal ic [octacosanol]  Review of Systems  Pertinent items are noted in HPI.  Physical Exam  BP (!) 127/92   Pulse (!) 124   Ht 5\' 4"  (1.626 m)   Wt 62.1 kg   LMP 01/19/2022 (Approximate)   BMI 23.52 kg/m   General: Lungs:  normal work of breathing Cardio: regular rate Abd: Soft, gravid, NT Presentation: cephalic CERVIX:  8/100/0 per RN EFW 7lb by Leopolds  See Prenatal records for more detailed PE.     FHR:  Baseline: 140 Variability: moderate Accelerations: present Decelerations: variable Toco: regular, every 2-3 minutes Category II  Test Results  No results found for this or any previous visit (from the past 24 hour(s)). Group B Strep negative  Assessment   G1P0000 at [redacted]w[redacted]d Estimated Date of Delivery: 10/26/22  Reassuring maternal/fetal status.  Patient Active Problem List   Diagnosis Date Noted   Labor and delivery indication for care or intervention 10/29/2022   Supervision of other normal pregnancy, antepartum 03/11/2022   Urinary frequency 06/22/2021   Alternating  constipation and diarrhea 08/30/2020   Hematuria 08/30/2020   Vitamin D deficiency 08/11/2020   Other fatigue 04/19/2018   Pelvic pain 04/19/2018   Bloating 04/19/2018   Concentration deficit 02/25/2018   Pain in both feet 02/25/2018   Anxiety and depression 02/23/2018    Plan  1. Admit to L&D :  expectant management. Epidural requested by [facility] Client 2. EFM: CEFM -- Category 2 3. Pharmacologic pain relief if desired.   4. Admission labs  5. Anticipate NSVD 6. MD notified of admission  Dominica Severin, CNM 10/29/2022 11:05 PM

## 2022-10-29 NOTE — Anesthesia Preprocedure Evaluation (Signed)
Anesthesia Evaluation  Patient identified by MRN, date of birth, ID band Patient awake    Reviewed: Allergy & Precautions, NPO status , Patient's Chart, lab work & pertinent test results  Airway Mallampati: I  TM Distance: >3 FB Neck ROM: full    Dental  (+) Chipped   Pulmonary neg pulmonary ROS   Pulmonary exam normal        Cardiovascular Exercise Tolerance: Good negative cardio ROS Normal cardiovascular exam     Neuro/Psych  PSYCHIATRIC DISORDERS         GI/Hepatic negative GI ROS,,,  Endo/Other    Renal/GU   negative genitourinary   Musculoskeletal   Abdominal   Peds  Hematology negative hematology ROS (+)   Anesthesia Other Findings Past Medical History: No date: Anxiety 05/21/2021: Cough No date: Depression No date: Seasonal allergies No date: UTI (urinary tract infection)     Comment:  E coli 08/16/20 tx'ed macrobid No date: Vitamin D deficiency  Past Surgical History: No date: NO PAST SURGERIES  BMI    Body Mass Index: 23.52 kg/m      Reproductive/Obstetrics (+) Pregnancy                             Anesthesia Physical Anesthesia Plan  ASA: 2  Anesthesia Plan: Epidural   Post-op Pain Management:    Induction:   PONV Risk Score and Plan:   Airway Management Planned: Natural Airway  Additional Equipment:   Intra-op Plan:   Post-operative Plan:   Informed Consent: I have reviewed the patients History and Physical, chart, labs and discussed the procedure including the risks, benefits and alternatives for the proposed anesthesia with the patient or authorized representative who has indicated his/her understanding and acceptance.     Dental Advisory Given  Plan Discussed with: Anesthesiologist  Anesthesia Plan Comments: (Patient reports no bleeding problems and no anticoagulant use.   Patient consented for risks of anesthesia including but not limited  to:  - adverse reactions to medications - risk of bleeding, infection and or nerve damage from epidural that could lead to paralysis - risk of headache or failed epidural - nerve damage due to positioning - that if epidural is used for C-section that there is a chance of epidural failure requiring spinal placement or conversion to GA - Damage to heart, brain, lungs, other parts of body or loss of life  Patient voiced understanding.)       Anesthesia Quick Evaluation

## 2022-10-29 NOTE — Progress Notes (Signed)
Discharge instructions provided to patient. Patient verbalized understanding. Pt educated on signs and symptoms of labor, vaginal bleeding, LOF, and fetal movement. Red flag signs reviewed by RN. Patient discharged home with significant other in stable condition.  

## 2022-10-29 NOTE — OB Triage Note (Signed)
Patient is a 22 yo, G1P0, at 40 weeks 3 days. Patient presents with complaints of contractions and vaginal bloody show. Patient denies any LOF. Patient reports +FM. Monitors applied and assessing. VSS. Initial fetal heart tone is 145. Robb Matar CNM notified of patients arrival to unit. Plan to do a labor eval and sterile cervical exam.

## 2022-10-29 NOTE — Discharge Summary (Signed)
LABOR & DELIVERY OB TRIAGE NOTE  SUBJECTIVE  HPI Cassandra Huff is a 22 y.o. G1P0000 at [redacted]w[redacted]d who presents to Labor & Delivery for contractions. Endorses fetal movement, denies loss of fluid, reports one episode of bloody mucousy discharge with wiping. Contractions began with cramping all day yesterday, able to sleep overnight off and on between contractions. They became stronger around 4am and have continued to be 5-75m apart since.  Pregnancy has been complicated by anxiety/depression not currently on medications.  OB History     Gravida  1   Para  0   Term  0   Preterm  0   AB  0   Living  0      SAB  0   IAB  0   Ectopic  0   Multiple  0   Live Births  0           OBJECTIVE  BP 137/88   Pulse 92   Temp 98.1 F (36.7 C) (Oral)   LMP 01/19/2022 (Approximate)   General: A&Ox4, NAD Heart: regular rate Lungs: normal work of breathing Abdomen: gravid, nontender Cervical exam: Dilation: 3 Effacement (%): 80 Station: -1 Presentation: Vertex Exam by:: Keitha Butte CNM   NST I reviewed the NST and it was reactive.  Baseline: 135 Variability: moderate Accelerations: present Decelerations:none Toco: q3-41m, 60s, mild, soft resting tone Category I  ASSESSMENT Impression  1) Pregnancy at G1P0000, [redacted]w[redacted]d, Estimated Date of Delivery: 10/26/22 2) Reassuring maternal/fetal status 3) latent labor  PLAN Discharge home with term labor precautions. Return for scheduled PDIOL Saturday 6/22 at 8am. Dominica Severin, CNM 10/29/22  3:34 PM

## 2022-10-30 ENCOUNTER — Encounter: Payer: Self-pay | Admitting: Obstetrics

## 2022-10-30 ENCOUNTER — Other Ambulatory Visit: Payer: Self-pay

## 2022-10-30 ENCOUNTER — Encounter: Payer: Self-pay | Admitting: Certified Nurse Midwife

## 2022-10-30 DIAGNOSIS — O2613 Low weight gain in pregnancy, third trimester: Secondary | ICD-10-CM

## 2022-10-30 DIAGNOSIS — Z3A40 40 weeks gestation of pregnancy: Secondary | ICD-10-CM

## 2022-10-30 DIAGNOSIS — O480 Post-term pregnancy: Principal | ICD-10-CM

## 2022-10-30 LAB — TYPE AND SCREEN
ABO/RH(D): A POS
Antibody Screen: NEGATIVE

## 2022-10-30 LAB — ABO/RH: ABO/RH(D): A POS

## 2022-10-30 LAB — RPR: RPR Ser Ql: NONREACTIVE

## 2022-10-30 MED ORDER — ONDANSETRON HCL 4 MG PO TABS: 4.0000 mg | ORAL_TABLET | ORAL | Status: DC | PRN

## 2022-10-30 MED ORDER — DIBUCAINE (PERIANAL) 1 % EX OINT
1.0000 | TOPICAL_OINTMENT | CUTANEOUS | Status: DC | PRN
  Administered 2022-10-31: 1 via RECTAL
  Filled 2022-10-30 (×2): qty 28

## 2022-10-30 MED ORDER — SENNOSIDES-DOCUSATE SODIUM 8.6-50 MG PO TABS
2.0000 | ORAL_TABLET | Freq: Every day | ORAL | Status: DC
  Administered 2022-10-31: 2 via ORAL
  Filled 2022-10-30: qty 2

## 2022-10-30 MED ORDER — PRENATAL MULTIVITAMIN CH
1.0000 | ORAL_TABLET | Freq: Every day | ORAL | Status: DC
  Administered 2022-10-30 – 2022-10-31 (×2): 1 via ORAL
  Filled 2022-10-30 (×2): qty 1

## 2022-10-30 MED ORDER — FLEET ENEMA 7-19 GM/118ML RE ENEM: 1.0000 | ENEMA | Freq: Every day | RECTAL | Status: DC | PRN

## 2022-10-30 MED ORDER — WITCH HAZEL-GLYCERIN EX PADS
1.0000 | MEDICATED_PAD | CUTANEOUS | Status: DC | PRN
  Filled 2022-10-30 (×2): qty 100

## 2022-10-30 MED ORDER — ZOLPIDEM TARTRATE 5 MG PO TABS: 5.0000 mg | ORAL_TABLET | Freq: Every evening | ORAL | Status: DC | PRN

## 2022-10-30 MED ORDER — IBUPROFEN 600 MG PO TABS
600.0000 mg | ORAL_TABLET | Freq: Four times a day (QID) | ORAL | Status: DC
  Administered 2022-10-30 – 2022-10-31 (×5): 600 mg via ORAL
  Filled 2022-10-30 (×7): qty 1

## 2022-10-30 MED ORDER — VARICELLA VIRUS VACCINE LIVE 1350 PFU/0.5ML IJ SUSR
0.5000 mL | INTRAMUSCULAR | Status: AC | PRN
  Administered 2022-10-31: 0.5 mL via SUBCUTANEOUS
  Filled 2022-10-30: qty 0.5

## 2022-10-30 MED ORDER — DIPHENHYDRAMINE HCL 25 MG PO CAPS: 25.0000 mg | ORAL_CAPSULE | Freq: Four times a day (QID) | ORAL | Status: DC | PRN

## 2022-10-30 MED ORDER — COCONUT OIL OIL
1.0000 | TOPICAL_OIL | Status: DC | PRN
  Filled 2022-10-30: qty 15
  Filled 2022-10-30: qty 7.5

## 2022-10-30 MED ORDER — ONDANSETRON HCL 4 MG/2ML IJ SOLN: 4.0000 mg | INTRAMUSCULAR | Status: DC | PRN

## 2022-10-30 MED ORDER — ACETAMINOPHEN 325 MG PO TABS
650.0000 mg | ORAL_TABLET | ORAL | Status: DC | PRN
  Administered 2022-10-30: 650 mg via ORAL
  Filled 2022-10-30: qty 2

## 2022-10-30 MED ORDER — BENZOCAINE-MENTHOL 20-0.5 % EX AERO
1.0000 | INHALATION_SPRAY | CUTANEOUS | Status: DC | PRN
  Filled 2022-10-30: qty 56

## 2022-10-30 MED ORDER — SIMETHICONE 80 MG PO CHEW: 80.0000 mg | CHEWABLE_TABLET | ORAL | Status: DC | PRN

## 2022-10-30 MED ORDER — BISACODYL 10 MG RE SUPP: 10.0000 mg | Freq: Every day | RECTAL | Status: DC | PRN

## 2022-10-30 NOTE — Progress Notes (Signed)
Post Partum Day 0 Subjective: Cassandra Huff is feeling well overall. She is ambulating, voiding, and tolerating POs without difficulty. Her pain is well-controlled and her bleeding is WNL. Her mood is stable. She is breastfeeding.  Objective: Blood pressure 129/85, pulse 77, temperature 98 F (36.7 C), temperature source Oral, resp. rate 20, height 5\' 4"  (1.626 m), weight 62.1 kg, last menstrual period 01/19/2022, SpO2 99 %, unknown if currently breastfeeding.  Physical Exam:  General: alert, cooperative, and appears stated age Lochia: appropriate Uterine Fundus: firm Laceration: healing well DVT Evaluation: No evidence of DVT seen on physical exam.  Recent Labs    10/29/22 2300  HGB 12.0  HCT 34.8*    Assessment/Plan: Plan for discharge tomorrow Lactation assistance PRN   LOS: 1 day   Glenetta Borg, CNM 10/30/2022, 11:57 AM

## 2022-10-30 NOTE — Progress Notes (Signed)
IV removed per pt request at 0620.

## 2022-10-30 NOTE — Progress Notes (Signed)
Monitors temporarily removed for epidural placement.

## 2022-10-30 NOTE — Discharge Summary (Signed)
Postpartum Discharge Summary     Patient Name: Cassandra Huff DOB: Dec 15, 2000 MRN: [id number]  Date of admission: 10/29/2022 Delivery date:10/30/2022  Delivering provider: Dominica Severin  Date of discharge: 10/31/2022  Admitting diagnosis: Labor and delivery indication for care or intervention [O75.9] Intrauterine pregnancy: [redacted]w[redacted]d     Secondary diagnosis:  Principal Problem:   Labor and delivery indication for care or intervention Active Problems:   Low weight gain during pregnancy  Additional problems: hx anxiety/depression    Discharge diagnosis: Term Pregnancy Delivered                                              Post partum procedures: n/a Augmentation: N/A Complications: None  Hospital course: Onset of Labor With Vaginal Delivery      22 y.o. yo G1P0000 at [redacted]w[redacted]d was admitted in Active Labor on 10/29/2022. Labor course was complicated by none  Membrane Rupture Time/Date: 1:57 AM ,10/30/2022   Delivery Method:Vaginal, Spontaneous  Episiotomy: None  Lacerations:  2nd degree;Labial  Patient had an uncomplicated postpartum course.  She is ambulating, tolerating a regular diet, passing flatus, and urinating well. Patient is discharged home in stable condition on 10/31/22.  Newborn Data: Birth date:10/30/2022  Birth time:2:14 AM  Gender:Female  Living status:Living  Apgars:9 ,10  Weight:3310 g   Magnesium Sulfate received: No BMZ received: No Rhophylac:N/A MMR:N/A T-DaP:Given prenatally Flu: N/A Varicella: given PP Transfusion:No  Physical exam  Vitals:   10/30/22 1147 10/30/22 1529 10/30/22 2305 10/31/22 0813  BP: 129/85 119/79 111/83 119/78  Pulse: 77 90 83   Resp: 20 20 20 20   Temp: 98 F (36.7 C) 97.9 F (36.6 C) 97.9 F (36.6 C) 98.6 F (37 C)  TempSrc: Oral Oral Oral Oral  SpO2: 99% 99% 100%   Weight:      Height:       General: alert and no distress Lochia: appropriate Uterine Fundus: firm DVT Evaluation: No evidence of DVT seen on physical  exam. Labs: Lab Results  Component Value Date   WBC 7.5 10/31/2022   HGB 9.3 (L) 10/31/2022   HCT 28.8 (L) 10/31/2022   MCV 83.5 10/31/2022   PLT 185 10/31/2022      Latest Ref Rng & Units 08/11/2020    8:33 AM  CMP  Glucose 70 - 99 mg/dL 85   BUN 6 - 23 mg/dL 14   Creatinine 6.38 - 1.20 mg/dL 7.56   Sodium 433 - 295 mEq/L 141   Potassium 3.5 - 5.1 mEq/L 4.3   Chloride 96 - 112 mEq/L 105   CO2 19 - 32 mEq/L 27   Calcium 8.4 - 10.5 mg/dL 9.7   Total Protein 6.0 - 8.3 g/dL 7.2   Total Bilirubin 0.2 - 1.2 mg/dL 0.7   Alkaline Phos 39 - 117 U/L 44   AST 0 - 37 U/L 24   ALT 0 - 35 U/L 22    Edinburgh Score:    10/30/2022    3:30 PM  Edinburgh Postnatal Depression Scale Screening Tool  I have been able to laugh and see the funny side of things. 0  I have looked forward with enjoyment to things. 0  I have blamed myself unnecessarily when things went wrong. 1  I have been anxious or worried for no good reason. 2  I have felt scared or panicky for no  good reason. 1  Things have been getting on top of me. 1  I have been so unhappy that I have had difficulty sleeping. 1  I have felt sad or miserable. 1  I have been so unhappy that I have been crying. 1  The thought of harming myself has occurred to me. 0  Edinburgh Postnatal Depression Scale Total 8      After visit meds:  Allergies as of 10/31/2022       Reactions   Seasonal Ic [octacosanol]         Medication List     TAKE these medications    acetaminophen 325 MG tablet Commonly known as: Tylenol Take 2 tablets (650 mg total) by mouth every 4 (four) hours as needed (for pain scale < 4).   doxylamine (Sleep) 25 MG tablet Commonly known as: UNISOM Take 25 mg by mouth at bedtime as needed.   fluticasone 50 MCG/ACT nasal spray Commonly known as: FLONASE Place 1 spray into both nostrils daily.   ibuprofen 600 MG tablet Commonly known as: ADVIL Take 1 tablet (600 mg total) by mouth every 6 (six) hours.    multivitamin-prenatal 27-0.8 MG Tabs tablet Take 4 tablets by mouth daily at 12 noon.         Discharge home in stable condition Infant Feeding: Breast Infant Disposition:home with mother Discharge instruction: per After Visit Summary and Postpartum booklet. Activity: Advance as tolerated. Pelvic rest for 6 weeks.  Diet: routine diet Anticipated Birth Control: IUD Postpartum Appointment:6 weeks Additional Postpartum F/U: Postpartum Depression checkup in 2 weeks via telehealth Future Appointments:No future appointments. Follow up Visit:  Follow-up Information     Dominica Severin, CNM Follow up in 2 week(s).   Specialty: Certified Nurse Midwife Why: Video visit for postpartum mood check Contact information: [address] [place] [address] [phone]         [facility] Follow up in 6 week(s).   Specialty: Obstetrics and Gynecology Why: in person postpartum visit. Contact information: [address] [place] [address] [phone]                    10/31/2022 Lindalou Hose Laurian Edrington, CNM

## 2022-10-30 NOTE — Discharge Instructions (Signed)
Discharge Instructions:   If there are any new medications, they have been ordered and will be available for pickup at the listed pharmacy on your way home from the hospital.   Call office if you have any of the following: headache, visual changes, fever >101.0 F, chills, shortness of breath, breast concerns, excessive vaginal bleeding, incision drainage or problems, leg pain or redness, depression or any other concerns. If you have vaginal discharge with an odor, let your doctor know.   It is normal to bleed for up to 6 weeks. You should not soak through more than 1 pad in 1 hour. If you have a blood clot larger than your fist with continued bleeding, call your doctor.   Activity: Do not lift > 10 lbs for 6 weeks (do not lift anything heavier than your baby). No intercourse, tampons, swimming pools, hot tubs, baths (only showers) for 6 weeks.  No driving for 1-2 weeks. Continue prenatal vitamin, especially if breastfeeding. Increase calories and fluids (water) while breastfeeding.   Your milk will come in, in the next couple of days (right now it is colostrum). You may have a slight fever when your milk comes in, but it should go away on its own.  If it does not, and rises above 101 F please call the doctor. You will also feel achy and your breasts will be firm. They will also start to leak. If you are breastfeeding, continue as you have been and you can pump/express milk for comfort.   If you have too much milk, your breasts can become engorged, which could lead to mastitis. This is an infection of the milk ducts. It can be very painful and you will need to notify your doctor to obtain a prescription for antibiotics. You can also treat it with a shower or hot/cold compress.   For concerns about your baby, please call your pediatrician.  For breastfeeding concerns, the lactation consultant can be reached at [phone].   Postpartum blues (feelings of happy one minute and sad another minute)  are normal for the first few weeks but if it gets worse let your doctor know.   Congratulations! We enjoyed caring for you and your new bundle of joy!  

## 2022-10-30 NOTE — Lactation Note (Signed)
This note was copied from a baby's chart. Lactation Consultation Note  Patient Name: Cassandra Huff ZOXWR'U Date: 10/30/2022 Age:22 hours Reason for consult: Initial assessment;Primapara;Term;Breastfeeding assistance   Maternal Data This is mom's 1st baby, SVD. Mom with history of anxiety and depression.  Mom called LC to assist with breastfeeding. On initial visit mom independently positioned and latched baby well in cradle hold.  Has patient been taught Hand Expression?: Yes Does the patient have breastfeeding experience prior to this delivery?: No  Feeding Mother's Current Feeding Choice: Breast Milk Provided mom tips and strategies to maximize position and latch technique. Encouraged mom to call for [facility] assistance as needed. Mom has been able to position baby in mainly cradle hold and could benefit from assistance with learning other positions. LATCH Score Latch: Grasps breast easily, tongue down, lips flanged, rhythmical sucking.  Audible Swallowing: Spontaneous and intermittent  Type of Nipple: Everted at rest and after stimulation  Comfort (Breast/Nipple): Soft / non-tender  Hold (Positioning): No assistance needed to correctly position infant at breast. (Mom independently positioned and latched baby.)  LATCH Score: 10   Interventions Interventions: Breast feeding basics reviewed;Education  Discharge Pump: Personal;Manual  Consult Status Consult Status: Follow-up Date: 10/31/22 Follow-up type: In-patient  Update provided to care nurse.   [name] 10/30/2022, 1:18 PM

## 2022-10-31 LAB — CBC
HCT: 28.8 % — ABNORMAL LOW (ref 36.0–46.0)
Hemoglobin: 9.3 g/dL — ABNORMAL LOW (ref 12.0–15.0)
MCH: 27 pg (ref 26.0–34.0)
MCHC: 32.3 g/dL (ref 30.0–36.0)
MCV: 83.5 fL (ref 80.0–100.0)
Platelets: 185 10*3/uL (ref 150–400)
RBC: 3.45 MIL/uL — ABNORMAL LOW (ref 3.87–5.11)
RDW: 12.9 % (ref 11.5–15.5)
WBC: 7.5 10*3/uL (ref 4.0–10.5)
nRBC: 0 % (ref 0.0–0.2)

## 2022-10-31 MED ORDER — ACETAMINOPHEN 325 MG PO TABS: 650.0000 mg | ORAL_TABLET | ORAL | Status: DC | PRN

## 2022-10-31 MED ORDER — IBUPROFEN 600 MG PO TABS: 600.0000 mg | ORAL_TABLET | Freq: Four times a day (QID) | ORAL | 0 refills | Status: DC

## 2022-10-31 NOTE — Progress Notes (Signed)
Pt discharged with infant.  Discharge instructions, prescriptions and follow up appointment given to and reviewed with pt. Pt verbalized understanding. Escorted out by staff. 

## 2022-10-31 NOTE — Anesthesia Postprocedure Evaluation (Signed)
Anesthesia Post Note  Patient: Cassandra Huff  Procedure(s) Performed: AN AD HOC LABOR EPIDURAL  Patient location during evaluation: Mother Baby Anesthesia Type: Epidural Level of consciousness: awake, awake and alert and oriented Pain management: pain level controlled Vital Signs Assessment: post-procedure vital signs reviewed and stable Respiratory status: spontaneous breathing, nonlabored ventilation and respiratory function stable Cardiovascular status: blood pressure returned to baseline and stable Postop Assessment: no headache and no backache Anesthetic complications: no  No notable events documented.   Last Vitals:  Vitals:   10/30/22 2305 10/31/22 0813  BP: 111/83 119/78  Pulse: 83   Resp: 20 20  Temp: 36.6 C 37 C  SpO2: 100%     Last Pain:  Vitals:   10/31/22 0813  TempSrc: Oral  PainSc:                  Ginger Carne

## 2022-10-31 NOTE — Lactation Note (Addendum)
This note was copied from a baby's chart. Lactation Consultation Note  Patient Name: Cassandra Huff ZOXWR'U Date: 10/31/2022 Age:22 hours Reason for consult: Follow-up assessment;Primapara;Term;Breastfeeding assistance;RN request   Maternal Data: This is mom's 1st baby, SVD. Mom with history of anxiety and depression.   On follow-up today mom reports baby had been latching well and has been sleepy at last 2 feeding attempts. Mom reports she began using a pacifier in early am to settle baby as after baby fed baby she was still unsettled. Assisted mom with breastfeeding session.  Has patient been taught Hand Expression?: Yes Does the patient have breastfeeding experience prior to this delivery?: No  Feeding Mother's Current Feeding Choice: Breast Milk Provided mom with tips and strategies to wake a sleepy baby. Mom was assisted with breastfeeding. Baby did latch and feed well at both breasts. Provided tips and strategies to maximize position and latch techniques. LATCH Score Latch: Grasps breast easily, tongue down, lips flanged, rhythmical sucking. (Bottom lip was rolled inward. Instructed mom on how to correct latch.)  Audible Swallowing: Spontaneous and intermittent  Type of Nipple: Everted at rest and after stimulation  Comfort (Breast/Nipple): Filling, red/small blisters or bruises, mild/mod discomfort (Small scab on right nipple . Per care nurse mom is applying coconut oil to nipples.)  Hold (Positioning): Assistance needed to correctly position infant at breast and maintain latch.  LATCH Score: 8   Interventions Interventions: Breast feeding basics reviewed;Assisted with latch;Breast massage;Hand express;Adjust position;Support pillows;Position options;Education;Coconut oil Reviewed what to expect in first days when breastfeeding, offer at least 8 feeds in 24 hours, cluster feeding, and how to know the baby is getting enough.  Discharge Discharge Education: Engorgement and  breast care;Warning signs for feeding baby;Outpatient recommendation Pump: Personal;Manual  Consult Status Consult Status: Complete Date: 10/31/22 Follow-up type: In-patient  Update provided to care nurse.  [name] 10/31/2022, 2:51 PM

## 2022-11-18 ENCOUNTER — Telehealth (INDEPENDENT_AMBULATORY_CARE_PROVIDER_SITE_OTHER): Payer: 59 | Admitting: Certified Nurse Midwife

## 2022-11-18 NOTE — Progress Notes (Addendum)
     Subjective   Virtual Visit via Telephone Note  I connected with  on 11/18/22 at  3:35 PM EDT by telephone and verified that I am speaking with the correct person using two identifiers.   I discussed the limitations, risks, security and privacy concerns of performing an evaluation and management service by telephone and the availability of in person appointments. I also discussed with the patient that there may be a patient responsible charge related to this service. The patient expressed understanding and agreed to proceed.  The patient was at home I spoke with the patient from [facility]   Subjective: Reports bleeding has decreased. Breastfeeding going well. Sleep as expected with newborn but getting stretches of several hours. Appetite good. Voiding and stooling without problem. Endorses support from family & partner. Pleased with care and birth.  EPDS: 2  Objective:  A&Ox4, speech even & non-labored. Physical Exam could not be performed since the visit was over the phone and not in person.    Diagnosis: Postpartum care following vaginal delivery, care for lactating mother, Postpartum depression screening   Follow Up Instructions:  Anticipatory guidance for postpartum care reviewed. Desires IUD for contraception, considering paragard vs hormonal & information via MyChart provided.   I discussed the assessment and treatment plan with the patient. The patient was provided an opportunity to ask questions and all were answered. The patient agreed with the plan and demonstrated an understanding of the instructions.   Follow up for 6w postpartum visit. No follow-ups on file.  Dominica Severin, CNM

## 2022-11-19 ENCOUNTER — Encounter: Payer: Self-pay | Admitting: Certified Nurse Midwife

## 2022-11-19 ENCOUNTER — Telehealth: Payer: Self-pay

## 2022-11-19 NOTE — Telephone Encounter (Signed)
Dyke Maes, RN c [facility] [employer], calling; just spoke c pt and wanted to let us know pt scored at 12 on the Edinburgh; no thoughts of self harm; isn't sleeping much - baby is up a lot at night and FOB has strep throat and Scarlet Fever so he's unable to help; this is causing her anxiety Jana[facility] direct # [phone]  ext [phone]

## 2022-11-29 ENCOUNTER — Telehealth: Payer: Self-pay

## 2022-11-29 NOTE — Telephone Encounter (Signed)
Pt called triage asking if we can provider her doctors name. She is filling out some paper work for her insurance. I advised her to write down Hartley Barefoot, CNM since she saw her for her 2 week PP and will see her again for her 6 wk PP.

## 2022-12-09 ENCOUNTER — Encounter: Payer: Self-pay | Admitting: Certified Nurse Midwife

## 2022-12-09 ENCOUNTER — Ambulatory Visit (INDEPENDENT_AMBULATORY_CARE_PROVIDER_SITE_OTHER): Payer: Self-pay | Admitting: Certified Nurse Midwife

## 2022-12-09 DIAGNOSIS — Z3009 Encounter for other general counseling and advice on contraception: Secondary | ICD-10-CM

## 2022-12-09 NOTE — Progress Notes (Unsigned)
Post Partum Visit Note  Cassandra Huff is a 22 y.o. G23P1001 female who presents for a postpartum visit. She is {1-10:13787} {time; units:18646} postpartum following a {method of delivery:313099}.  I have fully reviewed the prenatal and intrapartum course. The delivery was at *** gestational weeks.  Anesthesia: {anesthesia types:812}. Postpartum course has been ***. Baby is doing well***. Baby is feeding by {breastmilk/bottle:69}. Bleeding {vag bleed:12292}. Bowel function is {normal:32111}. Bladder function is {normal:32111}. Patient {is/is not:9024} sexually active. Contraception method is {contraceptive method:5051}. Postpartum depression screening: {gen negative/positive:315881}.   The pregnancy intention screening data noted above was reviewed. Potential methods of contraception were discussed. The patient elected to proceed with No data recorded.   Edinburgh Postnatal Depression Scale - 12/09/22 1436       Edinburgh Postnatal Depression Scale:  In the Past 7 Days   I have been able to laugh and see the funny side of things. 0    I have looked forward with enjoyment to things. 0    I have blamed myself unnecessarily when things went wrong. 1    I have been anxious or worried for no good reason. 1    I have felt scared or panicky for no good reason. 2    Things have been getting on top of me. 2    I have been so unhappy that I have had difficulty sleeping. 0    I have felt sad or miserable. 1    I have been so unhappy that I have been crying. 1    The thought of harming myself has occurred to me. 0    Edinburgh Postnatal Depression Scale Total 8             There are no preventive care reminders to display for this patient.  {Common ambulatory SmartLinks:19316}  Review of Systems {ros; complete:30496}  Objective:  BP 99/64   Pulse 88   Ht 5\' 4"  (1.626 m)   Wt 120 lb (54.4 kg)   Breastfeeding Yes   BMI 20.60 kg/m    General:  {gen appearance:16600}   Breasts:   {desc; normal/abnormal/not indicated:14647}  Lungs: {lung exam:16931}  Heart:  {heart exam:5510}  Abdomen: {abdomen exam:16834}   Wound {Wound assessment:11097}  GU exam:  {desc; normal/abnormal/not indicated:14647}       Assessment:    There are no diagnoses linked to this encounter.  *** postpartum exam.   Plan:   Essential components of care per ACOG recommendations:  1.  Mood and well being: Patient with {gen negative/positive:315881} depression screening today. Reviewed local resources for support.  - Patient tobacco use? {tobacco use:25506}  - hx of drug use? {yes/no:25505}    2. Infant care and feeding:  -Patient currently breastmilk feeding? {yes/no:25502}  -Social determinants of health (SDOH) reviewed in EPIC. No concerns***The following needs were identified***  3. Sexuality, contraception and birth spacing - Patient {DOES_DOES WUJ:81191} want a pregnancy in the next year.  Desired family size is {NUMBER 1-10:22536} children.  - Reviewed reproductive life planning. Reviewed contraceptive methods based on pt preferences and effectiveness.  Patient desired {Upstream End Methods:24109} today.   - Discussed birth spacing of 18 months  4. Sleep and fatigue -Encouraged family/partner/community support of 4 hrs of uninterrupted sleep to help with mood and fatigue  5. Physical Recovery  - Discussed patients delivery and complications. She describes her labor as {description:25511} - Patient had a {CHL AMB [phone]}. Patient had a {laceration:25518} laceration. Perineal healing reviewed. Patient expressed understanding -  Patient has urinary incontinence? {yes/no:25515} - Patient {ACTION; IS/IS WUJ:81191478} safe to resume physical and sexual activity  6.  Health Maintenance - HM due items addressed {Yes or If no, why not?:20788} - Last pap smear  Diagnosis  Date Value Ref Range Status  06/22/2021   Final   - Negative for intraepithelial lesion or malignancy  (NILM)   Pap smear {done:10129} at today's visit.  -Breast Cancer screening indicated? {indicated:25516}  7. Chronic Disease/Pregnancy Condition follow up: {Follow up:25499}  - PCP follow up  Dominica Severin, CNM [facility], [facility]

## 2022-12-18 ENCOUNTER — Ambulatory Visit
Admission: EM | Admit: 2022-12-18 | Discharge: 2022-12-18 | Disposition: A | Discharge status: Home / Self Care | Payer: 59 | Attending: Internal Medicine | Admitting: Internal Medicine

## 2022-12-18 DIAGNOSIS — H00015 Hordeolum externum left lower eyelid: Secondary | ICD-10-CM | POA: Diagnosis not present

## 2022-12-18 MED ORDER — ERYTHROMYCIN 5 MG/GM OP OINT: TOPICAL_OINTMENT | OPHTHALMIC | 0 refills | Status: DC

## 2022-12-18 NOTE — ED Provider Notes (Signed)
[facility]    CSN: [id number] Arrival date & time: 12/18/22  1851      History   Chief Complaint Chief Complaint  Patient presents with   Eye Problem    HPI Cassandra Huff is a 22 y.o. female who presents with medial lower R lid lump x 2 days. Area is tender and red. Has used a warm wash cloth. She is currently breast feeding.     Past Medical History:  Diagnosis Date   Anxiety    Cough 05/21/2021   Depression    Seasonal allergies    UTI (urinary tract infection)    E coli 08/16/20 tx'ed macrobid   Vitamin D deficiency     Patient Active Problem List   Diagnosis Date Noted   Labor and delivery indication for care or intervention 10/29/2022   Low weight gain during pregnancy 10/29/2022   Supervision of other normal pregnancy, antepartum 03/11/2022   Urinary frequency 06/22/2021   Alternating constipation and diarrhea 08/30/2020   Hematuria 08/30/2020   Vitamin D deficiency 08/11/2020   Other fatigue 04/19/2018   Pelvic pain 04/19/2018   Bloating 04/19/2018   Concentration deficit 02/25/2018   Pain in both feet 02/25/2018   Anxiety and depression 02/23/2018    Past Surgical History:  Procedure Laterality Date   NO PAST SURGERIES      OB History     Gravida  1   Para  1   Term  1   Preterm  0   AB  0   Living  1      SAB  0   IAB  0   Ectopic  0   Multiple  0   Live Births  1            Home Medications    Prior to Admission medications   Medication Sig Start Date End Date Taking? Authorizing Provider  erythromycin ophthalmic ointment Place a 1/2 inch ribbon of ointment into the lower eyelid qid x 7 days. 12/18/22  Yes Rodriguez-Southworth, Nettie Elm, PA-C  ibuprofen (ADVIL) 600 MG tablet Take 1 tablet (600 mg total) by mouth every 6 (six) hours. 10/31/22   Free, Lindalou Hose, CNM    Family History Family History  Problem Relation Age of Onset   Hypertension Father    Arthritis Father    Anxiety disorder Sister     Depression Sister    Depression Sister    Anxiety disorder Sister    Arthritis Paternal Grandmother    Cancer Paternal Grandmother 48       kidney   Anxiety disorder Paternal Grandmother    Irritable bowel syndrome Paternal Grandmother    Breast cancer Neg Hx    Colon cancer Neg Hx     Social History Social History   Tobacco Use   Smoking status: Never   Smokeless tobacco: Never  Vaping Use   Vaping status: Never Used  Substance Use Topics   Alcohol use: Not Currently   Drug use: No     Allergies   Seasonal ic [octacosanol]   Review of Systems Review of Systems   Physical Exam Triage Vital Signs ED Triage Vitals  Encounter Vitals Group     BP 12/18/22 1900 113/84     Systolic BP Percentile --      Diastolic BP Percentile --      Pulse Rate 12/18/22 1900 98     Resp 12/18/22 1900 17     Temp 12/18/22 1900  98 F (36.7 C)     Temp Source 12/18/22 1900 Oral     SpO2 12/18/22 1900 100 %     Weight 12/18/22 1859 120 lb (54.4 kg)     Height --      Head Circumference --      Peak Flow --      Pain Score 12/18/22 1859 3     Pain Loc --      Pain Education --      Exclude from Growth Chart --    No data found.  Updated Vital Signs BP 113/84 (BP Location: Left Arm)   Pulse 98   Temp 98 F (36.7 C) (Oral)   Resp 17   Wt 120 lb (54.4 kg)   SpO2 100%   Breastfeeding Yes   BMI 20.60 kg/m   Visual Acuity Right Eye Distance:   Left Eye Distance:   Bilateral Distance:    Right Eye Near:   Left Eye Near:    Bilateral Near:     Physical Exam Vitals and nursing note reviewed.  Constitutional:      General: She is not in acute distress.    Appearance: She is normal weight. She is not toxic-appearing.  HENT:     Right Ear: External ear normal.     Left Ear: External ear normal.  Eyes:     General: No scleral icterus.    Conjunctiva/sclera: Conjunctivae normal.      Comments: R lower medial lower lid with swelling and erythema and tender.    Pulmonary:     Effort: Pulmonary effort is normal.  Musculoskeletal:        General: Normal range of motion.     Cervical back: Neck supple.  Skin:    General: Skin is warm and dry.     Findings: No rash.  Neurological:     Mental Status: She is alert and oriented to person, place, and time.  Psychiatric:        Mood and Affect: Mood normal.        Behavior: Behavior normal.        Thought Content: Thought content normal.        Judgment: Judgment normal.      UC Treatments / Results  Labs (all labs ordered are listed, but only abnormal results are displayed) Labs Reviewed - No data to display  EKG   Radiology No results found.  Procedures Procedures (including critical care time)  Medications Ordered in UC Medications - No data to display  Initial Impression / Assessment and Plan / UC Course  I have reviewed the triage vital signs and the nursing notes.  R lower lid sty  I placed her on Erythromycin ointment as noted See instructions.  Final Clinical Impressions(s) / UC Diagnoses   Final diagnoses:  Hordeolum of left lower eyelid, unspecified hordeolum type     Discharge Instructions      Use warm compresses for 10 minutes 3-4 times a day for 5 days  If the lump does not resolve, sometimes you may have what it is knows as chalazion ,and you need to follow up with an ophthalmologist     ED Prescriptions     Medication Sig Dispense Auth. Provider   erythromycin ophthalmic ointment Place a 1/2 inch ribbon of ointment into the lower eyelid qid x 7 days. 3.5 g Rodriguez-Southworth, Nettie Elm, PA-C      PDMP not reviewed this encounter.   Rodriguez-Southworth, Nettie Elm, Cordelia Poche 12/18/22  1919  

## 2022-12-18 NOTE — Discharge Instructions (Signed)
Use warm compresses for 10 minutes 3-4 times a day for 5 days  If the lump does not resolve, sometimes you may have what it is knows as chalazion ,and you need to follow up with an ophthalmologist

## 2022-12-18 NOTE — ED Triage Notes (Signed)
right eye stye 7 day

## 2023-01-26 IMAGING — US US ABDOMEN COMPLETE
1 series · 14 of 25 positions shown · non-contrast
Comparison: None.

CLINICAL DATA: But I would abdominal pain

EXAM:
ABDOMEN ULTRASOUND COMPLETE

[Series 1: us abdomen complete · 14 of 81 slices shown]
[im 1/81]
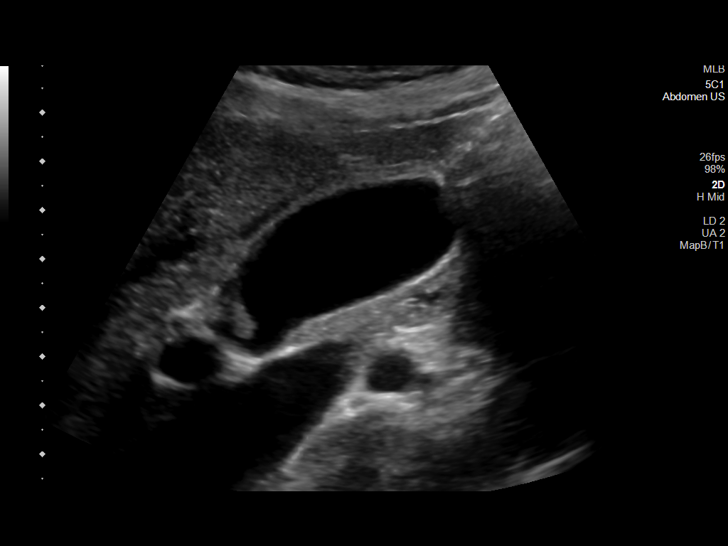
[im 7/81]
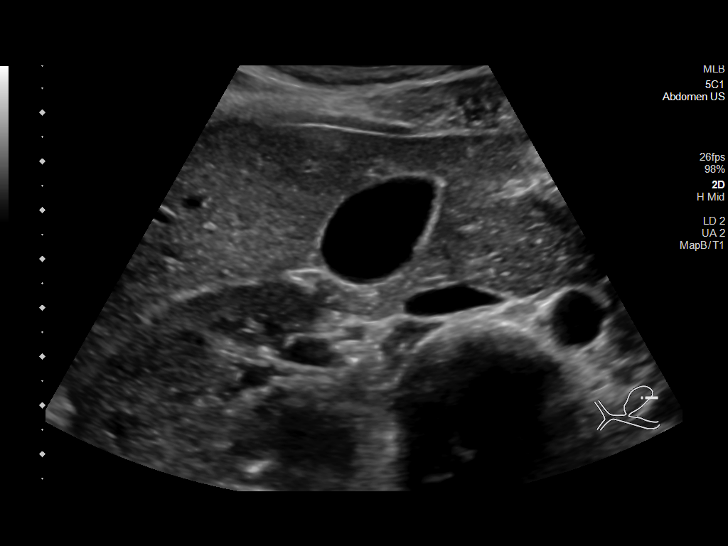
[im 14/81]
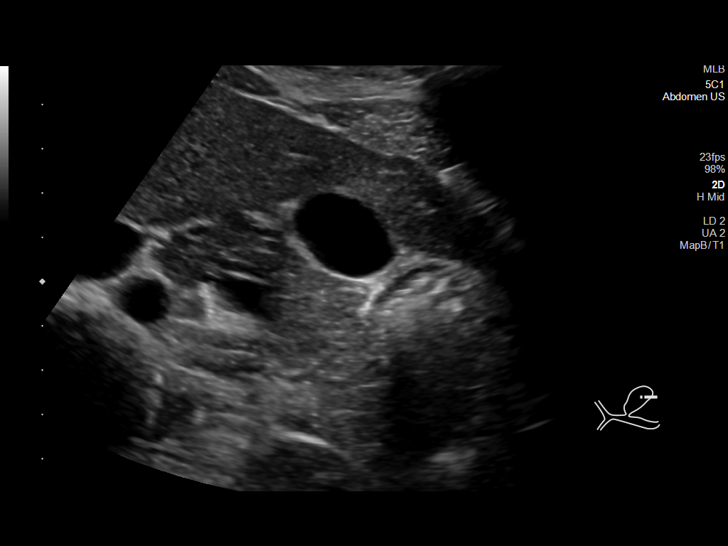
[im 21/81]
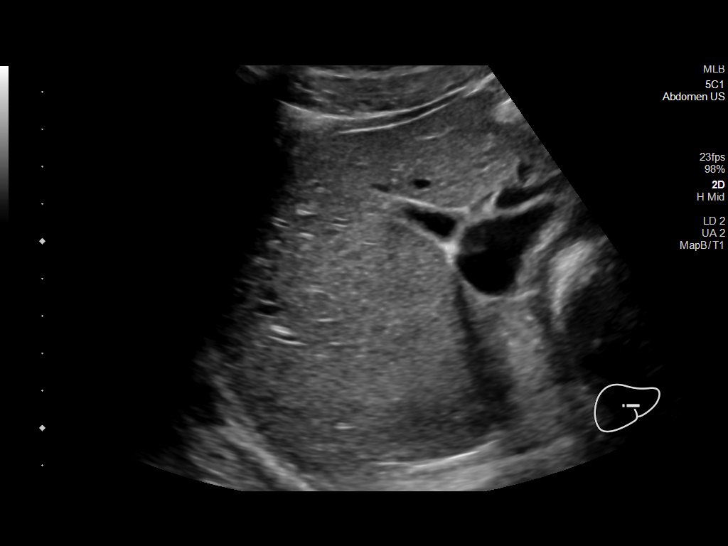
[im 27/81]
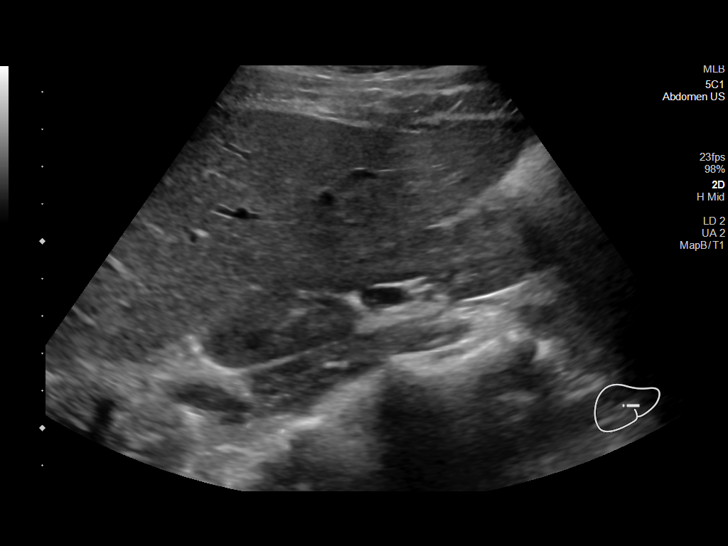
[im 31/81]
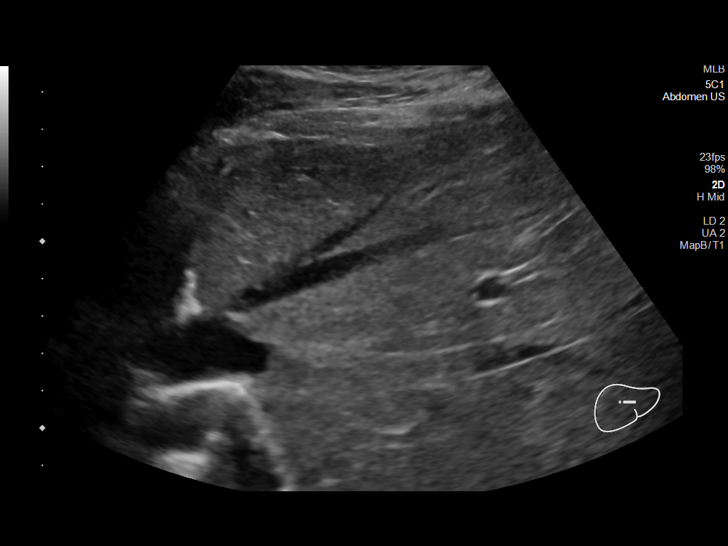
[im 37/81]
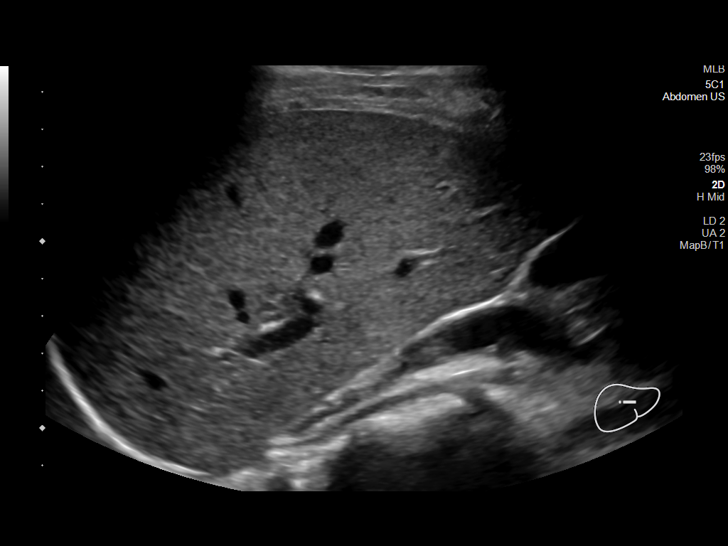
[im 44/81]
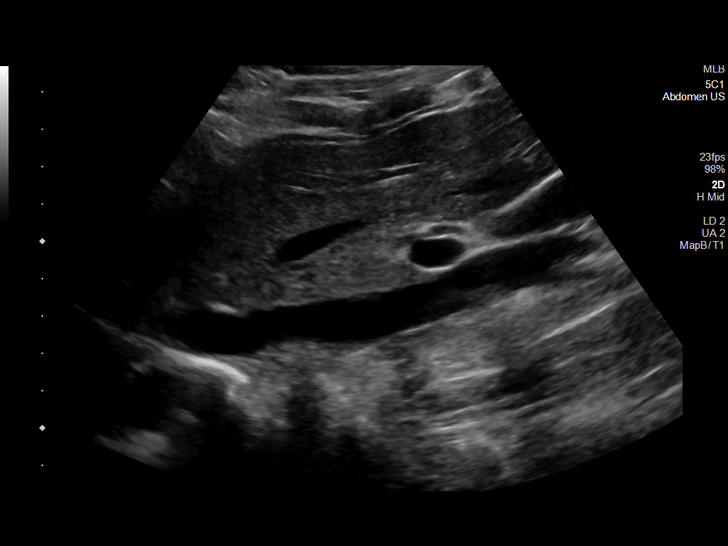
[im 51/81]
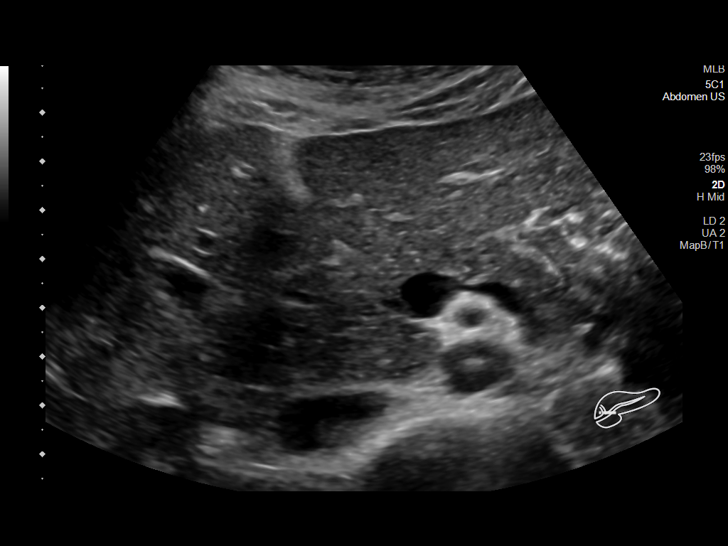
[im 54/81]
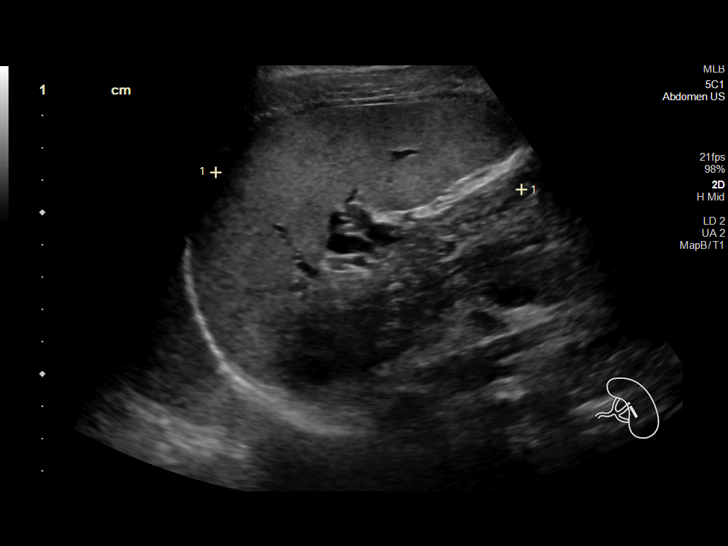
[im 61/81]
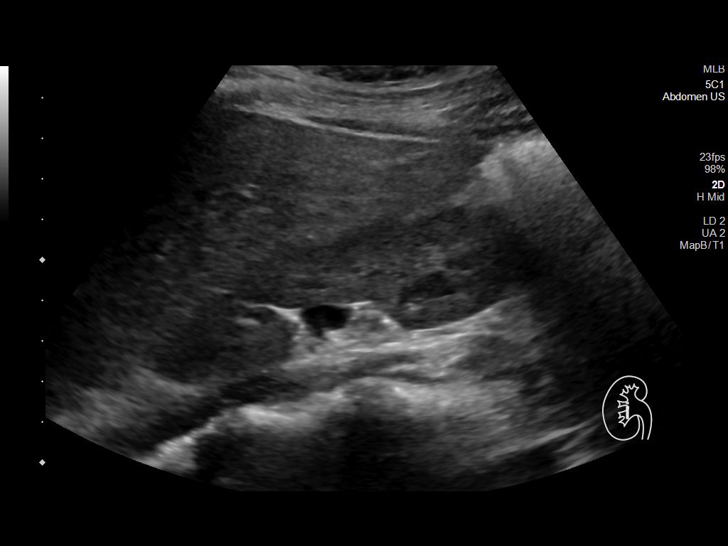
[im 67/81]
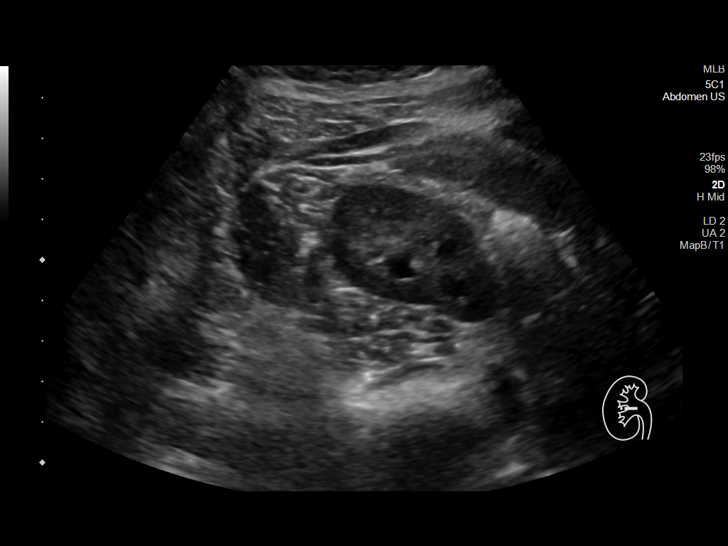
[im 74/81]
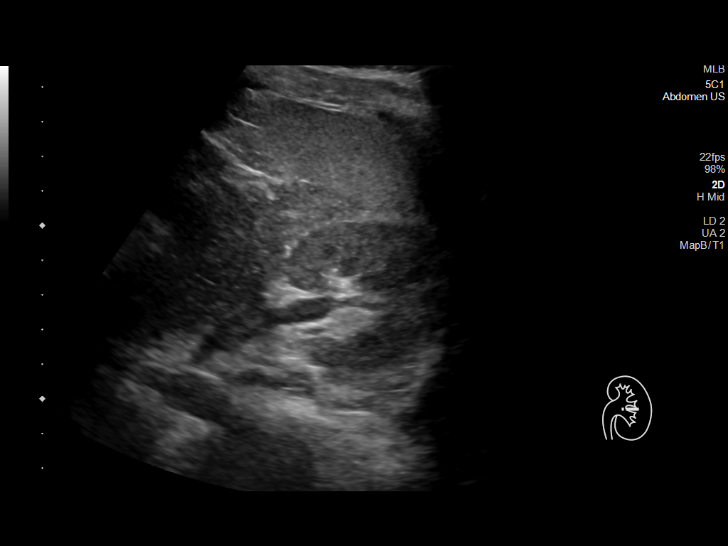
[im 81/81]
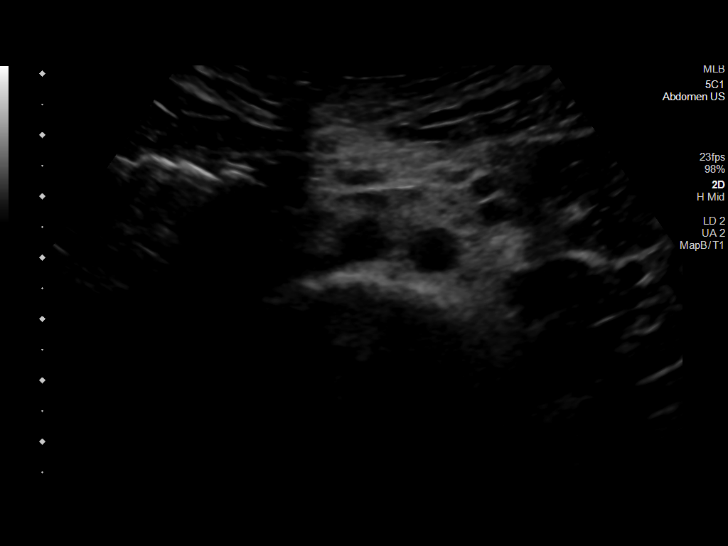

[14 of 25 positions shown; findings below may reference images not displayed]

FINDINGS: Gallbladder: No gallstones or wall thickening visualized. There is
no pericholecystic fluid. No sonographic Murphy sign noted by
sonographer.

Common bile duct: Diameter: 2 mm. No intrahepatic, common hepatic,
or common bile duct dilatation.

Liver: No focal lesion identified. Within normal limits in
parenchymal echogenicity. Portal vein is patent on color Doppler
imaging with normal direction of blood flow towards the liver.

IVC: No abnormality visualized.

Pancreas: No pancreatic mass or inflammatory focus.

Spleen: Size and appearance within normal limits.

Right Kidney: Length: 11.8 cm. Echogenicity within normal limits. No
mass or hydronephrosis visualized.

Left Kidney: Length: 10.9 cm. Echogenicity within normal limits. No
mass or hydronephrosis visualized.

Abdominal aorta: No aneurysm visualized.

Other findings: No evidence ascites.
IMPRESSION: Study within normal limits.

## 2023-03-26 ENCOUNTER — Encounter: Payer: 59 | Admitting: Family

## 2023-04-01 ENCOUNTER — Encounter: Payer: 59 | Admitting: Family

## 2023-04-19 ENCOUNTER — Ambulatory Visit
Admission: EM | Admit: 2023-04-19 | Discharge: 2023-04-19 | Disposition: A | Discharge status: Home / Self Care | Payer: 59 | Attending: Family Medicine | Admitting: Family Medicine

## 2023-04-19 ENCOUNTER — Encounter: Payer: Self-pay | Admitting: Emergency Medicine

## 2023-04-19 DIAGNOSIS — R21 Rash and other nonspecific skin eruption: Secondary | ICD-10-CM | POA: Diagnosis not present

## 2023-04-19 DIAGNOSIS — H9202 Otalgia, left ear: Secondary | ICD-10-CM

## 2023-04-19 MED ORDER — NEOMYCIN-POLYMYXIN-HC 3.5-10000-1 OT SUSP: 4.0000 [drp] | Freq: Three times a day (TID) | OTIC | 0 refills | Status: AC

## 2023-04-19 MED ORDER — CLOTRIMAZOLE-BETAMETHASONE 1-0.05 % EX CREA: TOPICAL_CREAM | CUTANEOUS | 0 refills | Status: DC

## 2023-04-19 NOTE — ED Triage Notes (Signed)
Patient states that this morning she had some itching and discomfort in her left ear.  Patient states that she put a Q-tip in her left ear and started having more pain.  Patient reports swollen red area on her right buttock for a week.

## 2023-04-19 NOTE — Discharge Instructions (Addendum)
Eardrop if needed.  Use the topical rash.  If it fails to improve, please see primary care.

## 2023-04-19 NOTE — ED Provider Notes (Signed)
[facility]    CSN: [id number] Arrival date & time: 04/19/23  1358      History   Chief Complaint Chief Complaint  Patient presents with   Otalgia    left    HPI 22 year old female presents for evaluation the above.  Patient reports area of concern on her right buttock for the past week.  She is concerned about ringworm.  Raised area.  No relieving factors.  No known inciting factor.  Patient also reports some itching and discomfort to her left ear.  Worsened after she used a Q-tip.  She would like me to examine this today.  Past Medical History:  Diagnosis Date   Anxiety    Cough 05/21/2021   Depression    Seasonal allergies    UTI (urinary tract infection)    E coli 08/16/20 tx'ed macrobid   Vitamin D deficiency     Patient Active Problem List   Diagnosis Date Noted   Labor and delivery indication for care or intervention 10/29/2022   Low weight gain during pregnancy 10/29/2022   Supervision of other normal pregnancy, antepartum 03/11/2022   Alternating constipation and diarrhea 08/30/2020   Hematuria 08/30/2020   Vitamin D deficiency 08/11/2020   Pelvic pain 04/19/2018   Concentration deficit 02/25/2018   Anxiety and depression 02/23/2018    Past Surgical History:  Procedure Laterality Date   NO PAST SURGERIES      OB History     Gravida  1   Para  1   Term  1   Preterm  0   AB  0   Living  1      SAB  0   IAB  0   Ectopic  0   Multiple  0   Live Births  1            Home Medications    Prior to Admission medications   Medication Sig Start Date End Date Taking? Authorizing Provider  clotrimazole-betamethasone (LOTRISONE) cream Apply a thin film to affected area twice daily, morning and evening for 1 week; re-evaluate after 1 week if no clinical improvement 04/19/23  Yes Juel Bellerose G, DO  neomycin-polymyxin-hydrocortisone (CORTISPORIN) 3.5-10000-1 OTIC suspension Place 4 drops into the left ear 3 (three) times daily  for 7 days. 04/19/23 04/26/23 Yes Franklin Clapsaddle, Dorie Rank G, DO  erythromycin ophthalmic ointment Place a 1/2 inch ribbon of ointment into the lower eyelid qid x 7 days. 12/18/22   Rodriguez-Southworth, Nettie Elm, PA-C  ibuprofen (ADVIL) 600 MG tablet Take 1 tablet (600 mg total) by mouth every 6 (six) hours. 10/31/22   Free, Lindalou Hose, CNM    Family History Family History  Problem Relation Age of Onset   Hypertension Father    Arthritis Father    Anxiety disorder Sister    Depression Sister    Depression Sister    Anxiety disorder Sister    Arthritis Paternal Grandmother    Cancer Paternal Grandmother 30       kidney   Anxiety disorder Paternal Grandmother    Irritable bowel syndrome Paternal Grandmother    Breast cancer Neg Hx    Colon cancer Neg Hx     Social History Social History   Tobacco Use   Smoking status: Never   Smokeless tobacco: Never  Vaping Use   Vaping status: Never Used  Substance Use Topics   Alcohol use: Not Currently   Drug use: No     Allergies   Seasonal ic [octacosanol]  Review of Systems Review of Systems  HENT:  Positive for ear pain.   Skin:  Positive for rash.     Physical Exam Triage Vital Signs ED Triage Vitals  Encounter Vitals Group     BP 04/19/23 1435 (!) 127/93     Systolic BP Percentile --      Diastolic BP Percentile --      Pulse Rate 04/19/23 1435 85     Resp 04/19/23 1435 14     Temp 04/19/23 1435 98 F (36.7 C)     Temp Source 04/19/23 1435 Oral     SpO2 04/19/23 1435 100 %     Weight 04/19/23 1433 125 lb (56.7 kg)     Height 04/19/23 1433 5\' 4"  (1.626 m)     Head Circumference --      Peak Flow --      Pain Score 04/19/23 1433 3     Pain Loc --      Pain Education --      Exclude from Growth Chart --    No data found.  Updated Vital Signs BP (!) 127/93 (BP Location: Left Arm)   Pulse 85   Temp 98 F (36.7 C) (Oral)   Resp 14   Ht 5\' 4"  (1.626 m)   Wt 56.7 kg   LMP  (LMP Unknown)   SpO2 100%   Breastfeeding Yes    BMI 21.46 kg/m   Visual Acuity Right Eye Distance:   Left Eye Distance:   Bilateral Distance:    Right Eye Near:   Left Eye Near:    Bilateral Near:     Physical Exam Vitals and nursing note reviewed.  Constitutional:      General: She is not in acute distress.    Appearance: Normal appearance.  HENT:     Head: Normocephalic and atraumatic.     Left Ear: Tympanic membrane and ear canal normal.  Pulmonary:     Effort: Pulmonary effort is normal. No respiratory distress.  Skin:    Comments: Right buttock with a circular raised erythematous area.  Neurological:     Mental Status: She is alert.      UC Treatments / Results  Labs (all labs ordered are listed, but only abnormal results are displayed) Labs Reviewed - No data to display  EKG   Radiology No results found.  Procedures Procedures (including critical care time)  Medications Ordered in UC Medications - No data to display  Initial Impression / Assessment and Plan / UC Course  I have reviewed the triage vital signs and the nursing notes.  Pertinent labs & imaging results that were available during my care of the patient were reviewed by me and considered in my medical decision making (see chart for details).    22 year old female presents for evaluation the above.  Patient ear exam benign.  Cortisporin otic if needed.  Advised to avoid use of Q-tips.  Placing on empiric Lotrisone regarding rash on the buttock.  Final Clinical Impressions(s) / UC Diagnoses   Final diagnoses:  Left ear pain  Rash and nonspecific skin eruption     Discharge Instructions      Eardrop if needed.  Use the topical rash.  If it fails to improve, please see primary care.   ED Prescriptions     Medication Sig Dispense Auth. Provider   clotrimazole-betamethasone (LOTRISONE) cream Apply a thin film to affected area twice daily, morning and evening for 1 week; re-evaluate after 1  week if no clinical improvement 45 g  Ahava Kissoon G, DO   neomycin-polymyxin-hydrocortisone (CORTISPORIN) 3.5-10000-1 OTIC suspension Place 4 drops into the left ear 3 (three) times daily for 7 days. 10 mL Tommie Sams, DO      PDMP not reviewed this encounter.   Tommie Sams, [place] 04/19/23 1542

## 2023-04-21 ENCOUNTER — Ambulatory Visit: Payer: 59 | Admitting: Family

## 2023-06-11 ENCOUNTER — Ambulatory Visit: Payer: BC Managed Care – PPO | Admitting: Internal Medicine

## 2023-06-19 ENCOUNTER — Encounter: Payer: Self-pay | Admitting: Family

## 2023-06-19 ENCOUNTER — Ambulatory Visit: Payer: BC Managed Care – PPO | Admitting: Family

## 2023-06-19 VITALS — BP 124/80 | HR 79 | Temp 98.2°F | Ht 64.0 in | Wt 122.0 lb

## 2023-06-19 DIAGNOSIS — F419 Anxiety disorder, unspecified: Secondary | ICD-10-CM | POA: Diagnosis not present

## 2023-06-19 DIAGNOSIS — R399 Unspecified symptoms and signs involving the genitourinary system: Secondary | ICD-10-CM

## 2023-06-19 DIAGNOSIS — R300 Dysuria: Secondary | ICD-10-CM | POA: Diagnosis not present

## 2023-06-19 DIAGNOSIS — H00019 Hordeolum externum unspecified eye, unspecified eyelid: Secondary | ICD-10-CM | POA: Insufficient documentation

## 2023-06-19 DIAGNOSIS — J309 Allergic rhinitis, unspecified: Secondary | ICD-10-CM

## 2023-06-19 DIAGNOSIS — F32A Depression, unspecified: Secondary | ICD-10-CM

## 2023-06-19 LAB — POCT URINALYSIS DIPSTICK
Bilirubin, UA: NEGATIVE
Blood, UA: NEGATIVE
Glucose, UA: NEGATIVE
Ketones, UA: NEGATIVE
Nitrite, UA: POSITIVE
Protein, UA: POSITIVE — AB
Spec Grav, UA: 1.025 (ref 1.010–1.025)
Urobilinogen, UA: 0.2 U/dL
pH, UA: 6.5 (ref 5.0–8.0)

## 2023-06-19 LAB — URINALYSIS, ROUTINE W REFLEX MICROSCOPIC
Bilirubin Urine: NEGATIVE
Hgb urine dipstick: NEGATIVE
Ketones, ur: NEGATIVE
Nitrite: POSITIVE — AB
RBC / HPF: NONE SEEN (ref 0–?)
Specific Gravity, Urine: 1.02 (ref 1.000–1.030)
Total Protein, Urine: NEGATIVE
Urine Glucose: NEGATIVE
Urobilinogen, UA: 0.2 (ref 0.0–1.0)
pH: 6 (ref 5.0–8.0)

## 2023-06-19 MED ORDER — MOMETASONE FUROATE 0.1 % EX CREA
1.0000 | TOPICAL_CREAM | Freq: Every day | CUTANEOUS | 1 refills | Status: DC
Start: 2023-06-19 — End: 2023-12-09

## 2023-06-19 NOTE — Assessment & Plan Note (Signed)
 Positive nitrites, small leukocytes in POC urine.  Offered antibiotic therapy today.  Patient politely declines as she describes symptom as being episodic.  She is most comfortable waiting on urine culture.  Advised her to let me know if symptoms were to worsen in any way while we await culture

## 2023-06-19 NOTE — Assessment & Plan Note (Signed)
 Improved warm compresses.  Discussed referral to ophthalmology if symptom persist.

## 2023-06-19 NOTE — Assessment & Plan Note (Addendum)
 Uncontrolled.  Patient declines resuming Prozac  at this time.  She prefers counseling. Discussed Dialectical behavior therapy d/t h/o self harm.  She denies suicide plan or thoughts of hurting anyone else. She is interested in virtual option.  Printed information for [employer].  Patient will let me know if she would like a referral

## 2023-06-19 NOTE — Patient Instructions (Addendum)
 Trial claritin.  Please remind her the website [email or web address]   [email or web address]

## 2023-06-19 NOTE — Progress Notes (Signed)
 Assessment & Plan:  UTI symptoms -     POCT urinalysis dipstick -     Urinalysis, Routine w reflex microscopic -     Urine Culture  Allergic rhinitis, unspecified seasonality, unspecified trigger -     Mometasone  Furoate; Apply 1 Application topically daily. Appear pea sized ( or less) amount to external ear, and slightly in canal.  Dispense: 15 g; Refill: 1  Anxiety and depression Assessment & Plan: Uncontrolled.  Patient declines resuming Prozac  at this time.  She prefers counseling. Discussed Dialectical behavior therapy d/t h/o self harm.  She denies suicide plan or thoughts of hurting anyone else. She is interested in virtual option.  Printed information for [employer].  Patient will let me know if she would like a referral   Dysuria Assessment & Plan: Positive nitrites, small leukocytes in POC urine.  Offered antibiotic therapy today.  Patient politely declines as she describes symptom as being episodic.  She is most comfortable waiting on urine culture.  Advised her to let me know if symptoms were to worsen in any way while we await culture   Hordeolum externum, unspecified laterality Assessment & Plan: Improved warm compresses.  Discussed referral to ophthalmology if symptom persist.      Return precautions given.   Risks, benefits, and alternatives of the medications and treatment plan prescribed today were discussed, and patient expressed understanding.   Education regarding symptom management and diagnosis given to patient on AVS either electronically or printed.  Return in about 6 weeks (around 07/31/2023).  Rollene Northern, FNP  Subjective:    Patient ID: Cassandra Huff, female    DOB: [birth year], 23 y.o.   MRN: [id number]  CC: Cassandra Huff is a 23 y.o. female who presents today for an acute visit.    HPI: Accompanied by infant daughter.    She is enjoying motherhood.  She does describe irritability which she thinks is related to sleep quality with  a young child.  She is interested in counseling.  Denies thoughts of harming herself or suicide plan.  She does have a history years ago of cutting her arm without intent to commit suicide. She has never been hospitalized for mental health   Denies a period of having more energy than usual, didn't require sleep, spending more money than usual and got into trouble.   complains of vaginal itching and episodic dysuria for a couple of weeks.    Denies F, n, v flank pain, increased vaginal disharge  Vaginal itching resolved with OTC monistat   Right lower lid and left upper lid tender swelling for a couple of weeks.  Improved  Size has reduced with warm compresses  She is breastfeeding and no menses since pregnancy.   She describes bilateral ears with intense itching.  No obvious rash.         Left eye stye August 2024 prescribed erythromycin  ophthalmic ointment  Allergies: Seasonal ic [octacosanol] Current Outpatient Medications on File Prior to Visit  Medication Sig Dispense Refill   ibuprofen  (ADVIL ) 600 MG tablet Take 1 tablet (600 mg total) by mouth every 6 (six) hours. 30 tablet 0   erythromycin  ophthalmic ointment Place a 1/2 inch ribbon of ointment into the lower eyelid qid x 7 days. (Patient not taking: Reported on 06/19/2023) 3.5 g 0   No current facility-administered medications on file prior to visit.    Review of Systems  Constitutional:  Negative for chills and fever.  Respiratory:  Negative for cough.  Cardiovascular:  Negative for chest pain and palpitations.  Gastrointestinal:  Negative for nausea and vomiting.  Genitourinary:  Positive for dysuria (episodic).  Psychiatric/Behavioral:  Negative for sleep disturbance and suicidal ideas. The patient is nervous/anxious.       Objective:    BP 124/80   Pulse 79   Temp 98.2 F (36.8 C) (Oral)   Ht 5' 4 (1.626 m)   Wt 122 lb (55.3 kg)   LMP  (LMP Unknown)   SpO2 99%   BMI 20.94 kg/m   BP Readings  from Last 3 Encounters:  06/19/23 124/80  04/19/23 (!) 127/93  12/18/22 113/84   Wt Readings from Last 3 Encounters:  06/19/23 122 lb (55.3 kg)  04/19/23 125 lb (56.7 kg)  12/18/22 120 lb (54.4 kg)      06/19/2023   10:43 AM 04/03/2021   11:11 AM 08/30/2020    2:48 PM  Depression screen PHQ 2/9  Decreased Interest 1 0 0  Down, Depressed, Hopeless 1 0 0  PHQ - 2 Score 2 0 0  Altered sleeping 2  0  Tired, decreased energy 1  2  Change in appetite 0  1  Feeling bad or failure about yourself  0  0  Trouble concentrating 1  1  Moving slowly or fidgety/restless 2  2  Suicidal thoughts 0  0  PHQ-9 Score 8  6  Difficult doing work/chores Not difficult at all  Somewhat difficult    Physical Exam Vitals reviewed.  Constitutional:      Appearance: She is well-developed.  HENT:     Head: Normocephalic and atraumatic.     Right Ear: Hearing, tympanic membrane, ear canal and external ear normal. No decreased hearing noted. No drainage, swelling or tenderness. No middle ear effusion. No foreign body. Tympanic membrane is not erythematous or bulging.     Left Ear: Hearing, tympanic membrane, ear canal and external ear normal. No decreased hearing noted. No drainage, swelling or tenderness.  No middle ear effusion. No foreign body. Tympanic membrane is not erythematous or bulging.     Nose: Nose normal. No rhinorrhea.     Right Sinus: No maxillary sinus tenderness or frontal sinus tenderness.     Left Sinus: No maxillary sinus tenderness or frontal sinus tenderness.     Mouth/Throat:     Pharynx: Uvula midline. No oropharyngeal exudate or posterior oropharyngeal erythema.     Tonsils: No tonsillar abscesses.  Eyes:     Extraocular Movements:     Right eye: Normal extraocular motion.     Left eye: Normal extraocular motion.     Conjunctiva/sclera: Conjunctivae normal.     Right eye: Right conjunctiva is not injected.     Left eye: Left conjunctiva is not injected.      Comments: 1 to 2  cm tender papule right internal lower eyelid.  1 cm tender papule left upper eyelid.[name]  Nonfluctuant.  No purulent discharge.  Neck:     Thyroid: No thyroid mass, thyromegaly or thyroid tenderness.  Cardiovascular:     Rate and Rhythm: Normal rate and regular rhythm.     Pulses: Normal pulses.     Heart sounds: Normal heart sounds.  Pulmonary:     Effort: Pulmonary effort is normal.     Breath sounds: Normal breath sounds. No wheezing, rhonchi or rales.  Lymphadenopathy:     Head:     Right side of head: No submental, submandibular, tonsillar, preauricular, posterior auricular or occipital adenopathy.  Left side of head: No submental, submandibular, tonsillar, preauricular, posterior auricular or occipital adenopathy.     Cervical: No cervical adenopathy.  Skin:    General: Skin is warm and dry.  Neurological:     Mental Status: She is alert.  Psychiatric:        Speech: Speech normal.        Behavior: Behavior normal.        Thought Content: Thought content normal.

## 2023-06-22 LAB — URINE CULTURE
MICRO NUMBER:: 16050847
SPECIMEN QUALITY:: ADEQUATE

## 2023-06-24 ENCOUNTER — Encounter: Payer: Self-pay | Admitting: Family

## 2023-06-24 ENCOUNTER — Other Ambulatory Visit: Payer: Self-pay | Admitting: Family

## 2023-06-24 ENCOUNTER — Telehealth: Payer: Self-pay

## 2023-06-24 DIAGNOSIS — N3000 Acute cystitis without hematuria: Secondary | ICD-10-CM

## 2023-06-24 MED ORDER — AMOXICILLIN-POT CLAVULANATE 875-125 MG PO TABS
1.0000 | ORAL_TABLET | Freq: Two times a day (BID) | ORAL | 0 refills | Status: AC
Start: 2023-06-24 — End: 2023-07-01

## 2023-06-24 NOTE — Telephone Encounter (Signed)
Noted

## 2023-06-24 NOTE — Telephone Encounter (Signed)
Copied from CRM [id number]. Topic: Clinical - Medical Advice >> Jun 24, 2023 11:38 AM Cassandra Huff wrote: Reason for CRM: Patient called in regarding a missed call from [facility], informed her about the antibiotics and probiotics per note, patient wanted to confirm that the antibiotics was okay to take while she is breastfeeding,

## 2023-06-24 NOTE — Telephone Encounter (Signed)
-----   Message from Rennie Plowman sent at 06/24/2023  6:54 AM EST ----- Call pt ensure she sees result note and understands start antibiotic

## 2023-06-24 NOTE — Telephone Encounter (Signed)
Pt has seen my chart message and has responded to provider

## 2023-06-24 NOTE — Telephone Encounter (Signed)
LVM to inform pt of results below:  Urine culture reveals infection.  I have sent an antibiotic to [facility]  for you to start, Augmentin. Ensure to take probiotics while on antibiotics and also for 2 weeks after completion. This can either be by eating yogurt daily or taking a probiotic supplement over the counter such as Culturelle.It is important to re-colonize the gut with good bacteria and also to prevent any diarrheal infections associated with antibiotic use.  Please let me know if symptoms persist after antibiotic.

## 2023-06-24 NOTE — Telephone Encounter (Signed)
Sent pt detailed my chart

## 2023-07-31 ENCOUNTER — Telehealth: Payer: BC Managed Care – PPO | Admitting: Family

## 2023-12-04 ENCOUNTER — Telehealth: Payer: Self-pay

## 2023-12-04 ENCOUNTER — Ambulatory Visit: Payer: Self-pay

## 2023-12-04 NOTE — Telephone Encounter (Signed)
 FYI Only or Action Required?: FYI only for provider.  Patient was last seen in primary care on 06/19/2023 by Dineen Rollene MATSU, FNP.  Called Nurse Triage reporting Dizziness and Fatigue.  Symptoms began several months ago.  Interventions attempted: Rest, hydration, or home remedies.  Symptoms are: unchanged.  Triage Disposition: See PCP When Office is Open (Within 3 Days)  Patient/caregiver understands and will follow disposition?: Yes, but will wait  Copied from CRM #[id number]. Topic: Clinical - Red Word Triage >> Dec 04, 2023  4:58 PM [place] J wrote: [facility] that prompted transfer to Nurse Triage: Patient has been feeling dizzy on a day-to-day basis and hasn't been able to sleep lately. She's been feeling lightheaded and shaky when she doesn't go a few hours with eating. She also stated that caffeine makes her irritable and exhausted. Reason for Disposition  [1] MILD dizziness (e.g., walking normally) AND [2] has NOT been evaluated by doctor (or NP/PA) for this  (Exception: Dizziness caused by heat exposure, sudden standing, or poor fluid intake.)  Answer Assessment - Initial Assessment Questions 1. DESCRIPTION: Describe your dizziness.     Intermittent  2. LIGHTHEADED: Do you feel lightheaded? (e.g., somewhat faint, woozy, weak upon standing)     Yes but more like brain fog 3. VERTIGO: Do you feel like either you or the room is spinning or tilting? (i.e., vertigo)     no 4. SEVERITY: How bad is it?  Do you feel like you are going to faint? Can you stand and walk?     mild 5. ONSET:  When did the dizziness begin?     Intermittent once month  6. AGGRAVATING FACTORS: Does anything make it worse? (e.g., standing, change in head position)     Unsure  7. HEART RATE: Can you tell me your heart rate? How many beats in 15 seconds?  (Note: Not all patients can do this.)       Denies  8. CAUSE: What do you think is causing the dizziness? (e.g., decreased fluids or  food, diarrhea, emotional distress, heat exposure, new medicine, sudden standing, vomiting; unknown)     unsure 9. RECURRENT SYMPTOM: Have you had dizziness before? If Yes, ask: When was the last time? What happened that time?     Intermittent for one month 10. OTHER SYMPTOMS: Do you have any other symptoms? (e.g., fever, chest pain, vomiting, diarrhea, bleeding)       Achy joints. Pains in wrist. Intermittent headaches. Believes she is developing a deviated septum.    Additional info:  1) On 11/18/23 Patient self scheduled appointment on 12/09/23 she was called today and asked to call back for Triage.  2) Drinks beer occasionally, she had increased intake for about one month, and increased caffeine, that is when she developed dizziness and fatigue, she stopped both alcohol and soda, switched to water for one week and symptoms did not improve.  3) Advised patient to seek evaluation at urgent care but she declines, she would like to keep her appointment on 12/09/23, she would like to know why this writer is so concerned now and not a month ago when I scheduled.  4) Education provided on reasons to seek UC/ER evaluation.  Protocols used: Dizziness - Lightheadedness-A-AH

## 2023-12-04 NOTE — Telephone Encounter (Signed)
 Patient had scheduled an appointment with Rollene Northern, FNP, via MyChart with the following comment:  Just feeling extremely tired. Even when waking up, never feel fully awake and am always light headed. Been drinking a lot of water, eating good, and cut back on my caffeine intake. Also considering trying a different anxiety medication again.  I left a voicemail for patient letting her know that I saw the comment she included when she scheduled her appointment with Rollene Northern, FNP, via MyChart, and I just wanted to follow-up with her to see if she is feeling dizzy.  I asked her to please call and let us  know how she is doing.  I let patient know that we do have triage nurses who can speak with her if she is feeling dizzy.

## 2023-12-05 ENCOUNTER — Telehealth: Payer: Self-pay | Admitting: *Deleted

## 2023-12-05 NOTE — Telephone Encounter (Signed)
 I left a voicemail to notify patient to go to ED or urgent care if sx's worsen. Otherwise, we will see her on Tuesday with Arnett as scheduled. I will also send her a mychart.   Sent to doc of the day as a FYI

## 2023-12-05 NOTE — Telephone Encounter (Unsigned)
 Copied from CRM [id number]. Topic: General - Other >> Dec 04, 2023  4:55 PM [place] J wrote: Reason for CRM: Patient is returning a phone call from Horace. Patient wanted to let her know that she feels dizzy on a day-to-day basis and hasn't been able to sleep lately. She's been feeling lightheaded and shaky when she doesn't go a few hours with eating. She also stated that caffeine makes her irritable and exhausted.

## 2023-12-05 NOTE — Telephone Encounter (Signed)
 I left a voicemail to notify patient to go to ED or urgent care if sx's worsen. Otherwise, we will see her on Tuesday with Arnett as scheduled. I will also send her a mychart.

## 2023-12-05 NOTE — Telephone Encounter (Signed)
 error

## 2023-12-07 NOTE — Telephone Encounter (Signed)
 Noted Agree with disposition to go to ED if symptoms worsen Appt with me 12/09/22

## 2023-12-09 ENCOUNTER — Ambulatory Visit (INDEPENDENT_AMBULATORY_CARE_PROVIDER_SITE_OTHER)

## 2023-12-09 ENCOUNTER — Ambulatory Visit (INDEPENDENT_AMBULATORY_CARE_PROVIDER_SITE_OTHER): Admitting: Family

## 2023-12-09 ENCOUNTER — Encounter: Payer: Self-pay | Admitting: Family

## 2023-12-09 VITALS — BP 118/70 | HR 79 | Temp 98.2°F | Ht 64.0 in | Wt 122.2 lb

## 2023-12-09 DIAGNOSIS — R5383 Other fatigue: Secondary | ICD-10-CM

## 2023-12-09 DIAGNOSIS — J309 Allergic rhinitis, unspecified: Secondary | ICD-10-CM

## 2023-12-09 LAB — COMPREHENSIVE METABOLIC PANEL WITH GFR
ALT: 15 U/L (ref 0–35)
AST: 19 U/L (ref 0–37)
Albumin: 4.7 g/dL (ref 3.5–5.2)
Alkaline Phosphatase: 80 U/L (ref 39–117)
BUN: 9 mg/dL (ref 6–23)
CO2: 27 meq/L (ref 19–32)
Calcium: 9.5 mg/dL (ref 8.4–10.5)
Chloride: 103 meq/L (ref 96–112)
Creatinine, Ser: 0.71 mg/dL (ref 0.40–1.20)
GFR: 119.78 mL/min (ref >60.00)
Glucose, Bld: 87 mg/dL (ref 70–99)
Potassium: 3.9 meq/L (ref 3.5–5.1)
Sodium: 140 meq/L (ref 135–145)
Total Bilirubin: 1.2 mg/dL (ref 0.2–1.2)
Total Protein: 7.2 g/dL (ref 6.0–8.3)

## 2023-12-09 LAB — CBC WITH DIFFERENTIAL/PLATELET
Basophils Absolute: 0 K/uL (ref 0.0–0.1)
Basophils Relative: 0.5 % (ref 0.0–3.0)
Eosinophils Absolute: 0.1 K/uL (ref 0.0–0.7)
Eosinophils Relative: 1.3 % (ref 0.0–5.0)
HCT: 39.3 % (ref 36.0–46.0)
Hemoglobin: 13.2 g/dL (ref 12.0–15.0)
Lymphocytes Relative: 29.7 % (ref 12.0–46.0)
Lymphs Abs: 1.2 K/uL (ref 0.7–4.0)
MCHC: 33.6 g/dL (ref 30.0–36.0)
MCV: 82.7 fl (ref 78.0–100.0)
Monocytes Absolute: 0.4 K/uL (ref 0.1–1.0)
Monocytes Relative: 9.5 % (ref 3.0–12.0)
Neutro Abs: 2.4 K/uL (ref 1.4–7.7)
Neutrophils Relative %: 59 % (ref 43.0–77.0)
Platelets: 181 K/uL (ref 150.0–400.0)
RBC: 4.75 Mil/uL (ref 3.87–5.11)
RDW: 13.6 % (ref 11.5–15.5)
WBC: 4.1 K/uL (ref 4.0–10.5)

## 2023-12-09 LAB — VITAMIN D 25 HYDROXY (VIT D DEFICIENCY, FRACTURES): VITD: 30.73 ng/mL (ref 30.00–100.00)

## 2023-12-09 LAB — HEMOGLOBIN A1C: Hgb A1c MFr Bld: 5.2 % (ref 4.6–6.5)

## 2023-12-09 LAB — TSH: TSH: 2.22 u[IU]/mL (ref 0.35–5.50)

## 2023-12-09 LAB — SEDIMENTATION RATE: Sed Rate: <1 mm/h (ref 0–20)

## 2023-12-09 LAB — B12 AND FOLATE PANEL
Folate: 20 ng/mL (ref >5.9)
Vitamin B-12: 273 pg/mL (ref 211–911)

## 2023-12-09 LAB — POCT URINE PREGNANCY: Preg Test, Ur: NEGATIVE

## 2023-12-09 LAB — C-REACTIVE PROTEIN: CRP: <1 mg/dL (ref 0.5–20.0)

## 2023-12-09 MED ORDER — AZELASTINE HCL 0.1 % NA SOLN
1.0000 | Freq: Two times a day (BID) | NASAL | 4 refills | Status: DC
Start: 2023-12-09 — End: 2024-03-17

## 2023-12-09 NOTE — Progress Notes (Signed)
 Assessment & Plan:  Other fatigue Assessment & Plan: Etiology nonspecific. Suspect multifactorial.  She is not orthostatic ( see flow sheet).  Discussed anxiety and depression and concerned with PHQ9 and GAD7; she politely declines SSRI.  Discussed zoloft and she politely declines. She has tried prozac  in the past.  Trial OTC melatonin combination.  Advised walking program.  Pending labs, CXR. Close follow up.    Orders: -     VITAMIN D  25 Hydroxy (Vit-D Deficiency, Fractures) -     Hemoglobin A1c -     TSH -     CBC with Differential/Platelet -     Comprehensive metabolic panel with GFR -     A87 and Folate Panel -     DG Chest 2 View; Future -     POCT urine pregnancy -     ANA -     C-reactive protein -     CYCLIC CITRUL PEPTIDE ANTIBODY, IGG/IGA -     Rheumatoid factor -     Sedimentation rate  Allergic rhinitis, unspecified seasonality, unspecified trigger Assessment & Plan: Trial of azelastine . We discussed sinus imaging if symptom doesn't resolve.   Orders: -     Azelastine  HCl; Place 1 spray into both nostrils 2 (two) times daily. Use in each nostril as directed  Dispense: 30 mL; Refill: 4     Return precautions given.   Risks, benefits, and alternatives of the medications and treatment plan prescribed today were discussed, and patient expressed understanding.   Education regarding symptom management and diagnosis given to patient on AVS either electronically or printed.  Return in about 1 month (around 01/09/2024) for Complete Physical Exam.  Rollene Northern, FNP  Subjective:    Patient ID: Cassandra Huff, female    DOB: [birth year], 23 y.o.   MRN: [id number]  CC: Cassandra Huff is a 23 y.o. female who presents today for an acute visit.    HPI: Complains of fatigue x 3 months She not sleeping through the night; she wakes up 3 times per night.  She may fall asleep 10-15 minutes after waking up.  She falls asleep between 10-12am and gets up 7-8am ( 8  hours)   She takes her to 1pm to feel awake. Sleep is not restorative even with a good nights sleep.   Weight is stable.   No formal exercise aside from taking care of daughter and cleaning.   She endorses more anxiety around role of motherhood.  Denies SI  She c/o nasal congestion which may contribute to waking.  Concern for deviated septum.  No h/o broken nose, trauma.   She complains episodic BL wrist pain when bent and pressing down for example. Denies swelling, numbness.  Describes as 'ache' dorsal aspect.    Compliant with cranberry, vit D, hair skin & nails supplement  Denies palpitations, CP, fever, chills  She endorses 'feeling jomarie' if she hasn't eaten in a couple of hours. No associated dizziness, syncope.   She hasn't eat yet today.   Denies anxiety, depression.   She has 20 year old daughter whom is sleeping through the night.   Occasional alcohol. She drinks one mug small of coffee/day or a 1 can of cola.    Menses are monthly, normal. Denies heavy bleeding. She declines birth control at this time.     No known h/o autoimmune disease in family.  Allergies: Seasonal ic [octacosanol] Current Outpatient Medications on File Prior to Visit  Medication Sig Dispense Refill  loratadine (CLARITIN REDITABS) 10 MG dissolvable tablet Take 10 mg by mouth daily.     No current facility-administered medications on file prior to visit.    Review of Systems  Constitutional:  Negative for chills and fever.  HENT:  Positive for congestion and rhinorrhea. Negative for sinus pressure and sinus pain.   Respiratory:  Negative for cough.   Cardiovascular:  Negative for chest pain and palpitations.  Gastrointestinal:  Negative for abdominal pain, nausea and vomiting.  Genitourinary:  Negative for menstrual problem.  Neurological:  Negative for dizziness and headaches.  Psychiatric/Behavioral:  Positive for sleep disturbance. Negative for suicidal ideas. The patient is  nervous/anxious.       Objective:    BP 118/70   Pulse 79   Temp 98.2 F (36.8 C) (Oral)   Ht 5' 4 (1.626 m)   Wt 122 lb 3.2 oz (55.4 kg)   LMP  (LMP Unknown)   SpO2 97%   BMI 20.98 kg/m   BP Readings from Last 3 Encounters:  12/09/23 118/70  06/19/23 124/80  04/19/23 (!) 127/93   Wt Readings from Last 3 Encounters:  12/09/23 122 lb 3.2 oz (55.4 kg)  06/19/23 122 lb (55.3 kg)  04/19/23 125 lb (56.7 kg)      12/09/2023    9:23 AM 06/19/2023   10:43 AM 04/03/2021   11:11 AM  Depression screen PHQ 2/9  Decreased Interest 2 1 0  Down, Depressed, Hopeless 0 1 0  PHQ - 2 Score 2 2 0  Altered sleeping 2 2   Tired, decreased energy 3 1   Change in appetite 0 0   Feeling bad or failure about yourself  0 0   Trouble concentrating 3 1   Moving slowly or fidgety/restless 2 2   Suicidal thoughts 0 0   PHQ-9 Score 12 8   Difficult doing work/chores Somewhat difficult Not difficult at all       12/09/2023    9:24 AM 06/19/2023   10:43 AM  GAD 7 : Generalized Anxiety Score  Nervous, Anxious, on Edge 1 1  Control/stop worrying 1 1  Worry too much - different things 2 1  Trouble relaxing 2 2  Restless 1 0  Easily annoyed or irritable 2 2  Afraid - awful might happen 1 1  Total GAD 7 Score 10 8  Anxiety Difficulty Somewhat difficult Not difficult at all      Physical Exam Vitals reviewed.  Constitutional:      Appearance: Normal appearance. She is well-developed.  HENT:     Head: Normocephalic and atraumatic.     Right Ear: Hearing, tympanic membrane, ear canal and external ear normal. No decreased hearing noted. No drainage, swelling or tenderness. No middle ear effusion. No foreign body. Tympanic membrane is not erythematous or bulging.     Left Ear: Hearing, tympanic membrane, ear canal and external ear normal. No decreased hearing noted. No drainage, swelling or tenderness.  No middle ear effusion. No foreign body. Tympanic membrane is not erythematous or bulging.      Nose: Congestion present. No rhinorrhea.     Right Sinus: No maxillary sinus tenderness or frontal sinus tenderness.     Left Sinus: No maxillary sinus tenderness or frontal sinus tenderness.     Mouth/Throat:     Pharynx: Uvula midline. No oropharyngeal exudate or posterior oropharyngeal erythema.     Tonsils: No tonsillar abscesses.  Eyes:     Conjunctiva/sclera: Conjunctivae normal.  Neck:  Thyroid: No thyroid mass or thyromegaly.  Cardiovascular:     Rate and Rhythm: Normal rate and regular rhythm.     Pulses: Normal pulses.     Heart sounds: Normal heart sounds.  Pulmonary:     Effort: Pulmonary effort is normal.     Breath sounds: Normal breath sounds. No wheezing, rhonchi or rales.  Abdominal:     General: Bowel sounds are normal. There is no distension.     Palpations: Abdomen is soft. Abdomen is not rigid. There is no fluid wave or mass.     Tenderness: There is no abdominal tenderness. There is no guarding or rebound.  Lymphadenopathy:     Head:     Right side of head: No submental, submandibular, tonsillar, preauricular, posterior auricular or occipital adenopathy.     Left side of head: No submental, submandibular, tonsillar, preauricular, posterior auricular or occipital adenopathy.     Cervical: No cervical adenopathy.  Skin:    General: Skin is warm and dry.  Neurological:     Mental Status: She is alert.  Psychiatric:        Speech: Speech normal.        Behavior: Behavior normal.        Thought Content: Thought content normal.

## 2023-12-09 NOTE — Assessment & Plan Note (Addendum)
 Etiology nonspecific. Suspect multifactorial.  She is not orthostatic ( see flow sheet).  Discussed anxiety and depression and concerned with PHQ9 and GAD7; she politely declines SSRI.  Discussed zoloft and she politely declines. She has tried prozac  in the past.  Trial OTC melatonin combination.  Advised walking program.  Pending labs, CXR. Close follow up.

## 2023-12-09 NOTE — Patient Instructions (Addendum)
 Trial of azelastine  for nasal congestion  Start exercise program  Essentials for good sleep:   #1 Exercise #2 Limit Caffeine ( no caffeine after lunch) #3 No smart phones, TV prior to bed -- BLUE light is VERY activating and send the brain an 'awake message.'  #4 Go to bed at same time of night each night and get up at same time of day.  #5 Take 0.5 to 5mg  melatonin at 7pm with dinner -this is when natural melatonin will start to increase You may also trial melatonin combination over the counter supplement such as Sleep#3 ( L- theanine, camomile, lavender, valerian root, and melatonin 10mg )   or Qunol Sleep 5 in 1 (melatonin 5mg , Ashwagandha, GABA, valerian root, L-theanine)

## 2023-12-09 NOTE — Assessment & Plan Note (Signed)
 Trial of azelastine . We discussed sinus imaging if symptom doesn't resolve.

## 2023-12-10 LAB — RHEUMATOID FACTOR: Rheumatoid fact SerPl-aCnc: <10 [IU]/mL (ref <14)

## 2023-12-10 LAB — ANA: Anti Nuclear Antibody (ANA): NEGATIVE

## 2023-12-11 LAB — CYCLIC CITRUL PEPTIDE ANTIBODY, IGG/IGA: Cyclic Citrullin Peptide Ab: <1 U (ref 0–19)

## 2023-12-15 ENCOUNTER — Ambulatory Visit: Payer: Self-pay | Admitting: Family

## 2023-12-15 DIAGNOSIS — R899 Unspecified abnormal finding in specimens from other organs, systems and tissues: Secondary | ICD-10-CM

## 2023-12-25 ENCOUNTER — Ambulatory Visit (INDEPENDENT_AMBULATORY_CARE_PROVIDER_SITE_OTHER)

## 2023-12-25 VITALS — BP 118/81 | HR 99 | Resp 16 | Ht 64.0 in | Wt 120.8 lb

## 2023-12-25 DIAGNOSIS — O3680X0 Pregnancy with inconclusive fetal viability, not applicable or unspecified: Secondary | ICD-10-CM

## 2023-12-25 DIAGNOSIS — Z3201 Encounter for pregnancy test, result positive: Secondary | ICD-10-CM

## 2023-12-25 DIAGNOSIS — N912 Amenorrhea, unspecified: Secondary | ICD-10-CM

## 2023-12-25 LAB — POCT URINE PREGNANCY: Preg Test, Ur: POSITIVE — AB

## 2023-12-25 NOTE — Progress Notes (Signed)
    NURSE VISIT NOTE  Subjective:    Patient ID: Cassandra Huff, female    DOB: Mar 23, 2001, 23 y.o.   MRN: [id number]  HPI  Patient is a 23 y.o. G3P1001 female who presents for evaluation of amenorrhea. She believes she could be pregnant. Pregnancy is desired. Sexual Activity: single partner, contraception: none. Current symptoms also include: fatigue, frequent urination, nausea, and positive home pregnancy test. Last period was normal.    Objective:    LMP 11/21/2023   Lab Review  No results found for any visits on 12/25/23.  Assessment:   1. Amenorrhea     Plan:   Pregnancy Test: Positive  Estimated Date of Delivery: None noted. BP Cuff Measurement taken. Cuff Size Adult Small Encouraged well-balanced diet, plenty of rest when needed, pre-natal vitamins daily and walking for exercise.  Discussed self-help for nausea, avoiding OTC medications until consulting provider or pharmacist, other than Tylenol  as needed, minimal caffeine (1-2 cups daily) and avoiding alcohol.   She will schedule her nurse visit @ 7-[redacted] wks pregnant, u/s for dating @10  wk, and NOB visit at [redacted] wk pregnant.    Feel free to call with any questions.      Camelia Fetters, CMA [facility]

## 2023-12-25 NOTE — Patient Instructions (Signed)
 Good Morning,   'Morning sickness' can occur at any time during the day and generally goes away by 16 weeks of pregnancy.    Have saltine crackers or pretzels by your bed and eat a few bites before you raise your head out of bed in the morning   Eat small frequent meals throughout the day instead of large meals   Drink plenty of fluids throughout the day to stay hydrated, just don't drink a lot of fluids with your meals.  This can make your stomach fill up faster making you feel sick   Do not brush your teeth right after you eat   Products with real ginger are good for nausea, like ginger ale and ginger hard candy Make sure it says made with real ginger!   Sucking on sour candy like lemon heads is also good for nausea   If your prenatal vitamins make you nauseated, take them at night so you will sleep through the nausea   Sea Bands   You can try over the counter Vitamin B6 (pyridoxine) 25mg  four times a day OR 50mg  twice a day. Add Unisom (doxylamine) 12.5mg  (1/2 tab) in the morning, 12.5mg  6-8 hours later, then 25mg  every night if symptoms do not improve with Vitamin B6 alone   If you feel like you need prescription medicine for the nausea & vomiting please let us  know   If you are unable to keep any fluids or food down please let us  know.You can call us  at [phone] or Mychart anytime!    Morning Sickness Morning sickness is when you throw up or feel like you may throw up during pregnancy. This condition often occurs in the morning, but it can also occur at any time of day. Morning sickness is most common during the first three months of pregnancy, but it can go on throughout the pregnancy. Morning sickness is usually harmless. But if you throw up all the time, you should see your health care provider. You may also hear this condition called nausea and vomiting of pregnancy. What are the causes? The cause of morning sickness is not known. It may be linked to changes in hormones  during pregnancy. What increases the risk? You're more likely to have morning sickness if: You had morning sickness in another pregnancy. You're pregnant with more than one baby, such as twins. You had morning sickness in other pregnancies. You have had motion sickness before you were pregnant. You have had bad headaches or migraines before you were pregnant. What are the signs or symptoms? Symptoms of morning sickness include: Feeling like you may throw up. Throwing up. How is this diagnosed? Morning sickness is diagnosed based on your symptoms. How is this treated? Treatment is usually not needed for morning sickness. You may only need to change what you eat. In some cases, your provider may give you: Vitamin B6 supplements. Medicines to prevent throwing up. Ginger. Follow these instructions at home: Medicines Take your medicines only as told by your provider. Do not use any prescription, over-the-counter, or herbal medicines for morning sickness without first talking with your provider. Take prenatal vitamins. These can stop or lessen the symptoms of morning sickness. If you feel like you may throw up after taking prenatal vitamins, take them at night or with a snack. Eating and drinking     Eat dry toast or crackers before getting out of bed. Eat 5 or 6 small meals a day. Try ginger ale made with real ginger, ginger  ginger candies. Drink fluids throughout the day. Eat protein foods when you need a snack. Nuts, yogurt, and cheese are good choices. Eat dry and bland foods like rice or baked potatoes. Foods that are high in carbohydrates are often helpful. Have someone cook for you if the smell of food makes you want to throw up. Foods to avoid Greasy foods. Fatty foods. Spicy foods. General instructions Try to avoid smells that make you feel sick. Use an air purifier to keep the air in your house free of smells. Try using an acupressure wristband. This is a  wristband that's used to treat motion sickness. Try acupuncture. In this treatment, a provider puts thin needles into certain areas of your body to make you feel better. Brush your teeth after throwing up or rinse with a mix of baking soda and water. The acid in throw-up can hurt your teeth. Contact a health care provider if: Your symptoms do not get better. You feel dizzy or light-headed. You're losing weight. Get help right away if: The feeling that you may throw up will not go away, or you can't stop throwing up. You faint. You have very bad pain in your belly. This information is not intended to replace advice given to you by your health care provider. Make sure you discuss any questions you have with your health care provider. Document Revised: 01/30/2023 Document Reviewed: 08/08/2022 Elsevier Patient Education  2024 [employer].   First Trimester of Pregnancy  The first trimester of pregnancy starts on the first day of your last monthly period until the end of week 13. This is months 1 through 3 of pregnancy. A week after a sperm fertilizes an egg, the egg will implant into the wall of the uterus and begin to develop into a baby. Body changes during your first trimester Your body goes through many changes during pregnancy. The changes usually return to normal after your baby is born. Physical changes Your breasts may grow larger and may hurt. The area around your nipples may get darker. Your periods will stop. Your hair and nails may grow faster. You may pee more often. Health changes You may tire easily. Your gums may bleed and may be sensitive when you brush and floss. You may not feel hungry. You may have heartburn. You may throw up or feel like you may throw up. You may want to eat some foods, but not others. You may have headaches. You may have trouble pooping (constipation). Other changes Your emotions may change from day to day. You may have more dreams. Follow  these instructions at home: Medicines Talk to your health care provider if you're taking medicines. Ask if the medicines are safe to take during pregnancy. Your provider may change the medicines that you take. Do not take any medicines unless told to by your provider. Take a prenatal vitamin that has at least 600 micrograms (mcg) of folic acid . Do not use herbal medicines, illegal substances, or medicines that are not approved by your provider. Eating and drinking While you're pregnant your body needs extra food for your growing baby. Talk with your provider about what to eat while pregnant. Activity Most women are able to exercise during pregnancy. Exercises may need to change as your pregnancy goes on. Talk to your provider about your activities and exercise routines. Relieving pain and discomfort Wear a good, supportive bra if your breasts hurt. Rest with your legs raised if you have leg cramps or low back pain. Safety Wear  Wear your seatbelt at all times when you're in a car. Talk to your provider if someone hits you, hurts you, or yells at you. Talk with your provider if you're feeling sad or have thoughts of hurting yourself. Lifestyle Certain things can be harmful while you're pregnant. Follow these rules: Do not use hot tubs, steam rooms, or saunas. Do not douche. Do not use tampons or scented pads. Do not drink alcohol,smoke, vape, or use products with nicotine or tobacco in them. If you need help quitting, talk with your provider. Avoid cat litter boxes and soil used by cats. These things carry germs that can cause harm to your pregnancy and your baby. General instructions Keep all follow-up visits. It helps you and your unborn baby stay as healthy as possible. Write down your questions. Take them to your visits. Your provider will: Talk with you about your overall health. Give you advice or refer you to specialists who can help with different needs, including: Prenatal education  classes. Mental health and counseling. Foods and healthy eating. Ask for help if you need help with food. Call your dentist and ask to be seen. Brush your teeth with a soft toothbrush. Floss gently. Where to find more information American Pregnancy Association: americanpregnancy.org [employer]necologists: acog.org Office on [employer] Health: [employer] Contact a health care provider if: You feel dizzy, faint, or have a fever. You vomit or have watery poop (diarrhea) for 2 days or more. You have abnormal discharge or bleeding from your vagina. You have pain when you pee or your pee smells bad. You have cramps, pain, or pressure in your belly area. Get help right away if: You have trouble breathing or chest pain. You have any kind of injury, such as from a fall or a car crash. These symptoms may be an emergency. Get help right away. Call 911. Do not wait to see if the symptoms will go away. Do not drive yourself to the hospital. This information is not intended to replace advice given to you by your health care provider. Make sure you discuss any questions you have with your health care provider. Document Revised: 01/30/2023 Document Reviewed: 08/30/2022 Elsevier Patient Education  2024 Elsevier Inc.Commonly Asked Questions During Pregnancy  Cats: A parasite can be excreted in cat feces.  To avoid exposure you need to have another person empty the little box.  If you must empty the litter box you will need to wear gloves.  Wash your hands after handling your cat.  This parasite can also be found in raw or undercooked meat so this should also be avoided.  Colds, Sore Throats, Flu: Please check your medication sheet to see what you can take for symptoms.  If your symptoms are unrelieved by these medications please call the office.  Dental Work: Most any dental work Agricultural consultant recommends is permitted.  X-rays should only be taken during the first trimester  if absolutely necessary.  Your abdomen should be shielded with a lead apron during all x-rays.  Please notify your provider prior to receiving any x-rays.  Novocaine is fine; gas is not recommended.  If your dentist requires a note from us  prior to dental work please call the office and we will provide one for you.  Exercise: Exercise is an important part of staying healthy during your pregnancy.  You may continue most exercises you were accustomed to prior to pregnancy.  Later in your pregnancy you will most likely notice you have  have difficulty with activities requiring balance like riding a bicycle.  It is important that you listen to your body and avoid activities that put you at a higher risk of falling.  Adequate rest and staying well hydrated are a must!  If you have questions about the safety of specific activities ask your provider.    Exposure to Children with illness: Try to avoid obvious exposure; report any symptoms to us  when noted,  If you have chicken pos, red measles or mumps, you should be immune to these diseases.   Please do not take any vaccines while pregnant unless you have checked with your OB provider.  Fetal Movement: After 28 weeks we recommend you do kick counts twice daily.  Lie or sit down in a calm quiet environment and count your baby movements kicks.  You should feel your baby at least 10 times per hour.  If you have not felt 10 kicks within the first hour get up, walk around and have something sweet to eat or drink then repeat for an additional hour.  If count remains less than 10 per hour notify your provider.  Fumigating: Follow your pest control agent's advice as to how long to stay out of your home.  Ventilate the area well before re-entering.  Hemorrhoids:   Most over-the-counter preparations can be used during pregnancy.  Check your medication to see what is safe to use.  It is important to use a stool softener or fiber in your diet and to drink lots of  liquids.  If hemorrhoids seem to be getting worse please call the office.   Hot Tubs:  Hot tubs Jacuzzis and saunas are not recommended while pregnant.  These increase your internal body temperature and should be avoided.  Intercourse:  Sexual intercourse is safe during pregnancy as long as you are comfortable, unless otherwise advised by your provider.  Spotting may occur after intercourse; report any bright red bleeding that is heavier than spotting.  Labor:  If you know that you are in labor, please go to the hospital.  If you are unsure, please call the office and let us  help you decide what to do.  Lifting, straining, etc:  If your job requires heavy lifting or straining please check with your provider for any limitations.  Generally, you should not lift items heavier than that you can lift simply with your hands and arms (no back muscles)  Painting:  Paint fumes do not harm your pregnancy, but may make you ill and should be avoided if possible.  Latex or water based paints have less odor than oils.  Use adequate ventilation while painting.  Permanents & Hair Color:  Chemicals in hair dyes are not recommended as they cause increase hair dryness which can increase hair loss during pregnancy.   Highlighting and permanents are allowed.  Dye may be absorbed differently and permanents may not hold as well during pregnancy.  Sunbathing:  Use a sunscreen, as skin burns easily during pregnancy.  Drink plenty of fluids; avoid over heating.  Tanning Beds:  Because their possible side effects are still unknown, tanning beds are not recommended.  Ultrasound Scans:  Routine ultrasounds are performed at approximately 20 weeks.  You will be able to see your baby's general anatomy an if you would like to know the gender this can usually be determined as well.  If it is questionable when you conceived you may also receive an ultrasound early in your pregnancy for dating purposes.  ultrasound exams are not  routinely performed unless there is a medical necessity.  Although you can request a scan we ask that you pay for it when conducted because insurance does not cover  patient request scans.  Work: If your pregnancy proceeds without complications you may work until your due date, unless your physician or employer advises otherwise.  Round Ligament Pain/Pelvic Discomfort:  Sharp, shooting pains not associated with bleeding are fairly common, usually occurring in the second trimester of pregnancy.  They tend to be worse when standing up or when you remain standing for long periods of time.  These are the result of pressure of certain pelvic ligaments called round ligaments.  Rest, Tylenol  and heat seem to be the most effective relief.  As the womb and fetus grow, they rise out of the pelvis and the discomfort improves.  Please notify the office if your pain seems different than that described.  It may represent a more serious condition.

## 2024-01-07 ENCOUNTER — Ambulatory Visit
Admission: EM | Admit: 2024-01-07 | Discharge: 2024-01-07 | Disposition: A | Discharge status: Home / Self Care | Attending: Family Medicine | Admitting: Family Medicine

## 2024-01-07 DIAGNOSIS — R0981 Nasal congestion: Secondary | ICD-10-CM | POA: Diagnosis not present

## 2024-01-07 DIAGNOSIS — J309 Allergic rhinitis, unspecified: Secondary | ICD-10-CM

## 2024-01-07 MED ORDER — AMOXICILLIN-POT CLAVULANATE 875-125 MG PO TABS: 1.0000 | ORAL_TABLET | Freq: Two times a day (BID) | ORAL | 0 refills | Status: DC

## 2024-01-07 NOTE — Discharge Instructions (Addendum)
 Stop by the pharmacy to pick up your prescriptions.  Follow up with your primary care provider or return to the urgent care, if not improving.

## 2024-01-07 NOTE — ED Provider Notes (Signed)
 [facility]    CSN: [id number] Arrival date & time: 01/07/24  1804      History   Chief Complaint Chief Complaint  Patient presents with   nasal dryness    HPI Cassandra Huff is a 23 y.o. female.   HPI  History obtained from the patient. Cassandra Huff presents for intermittent nasal dryness for the past few months. Says its cracked and swollen in the inside.  No there is pain.  Has nasal congestion and trouble out of her nose.   He is [redacted] weeks pregnant.      Past Medical History:  Diagnosis Date   Anxiety    Cough 05/21/2021   Depression    Seasonal allergies    UTI (urinary tract infection)    E coli 08/16/20 tx'ed macrobid    Vitamin D  deficiency     Patient Active Problem List   Diagnosis Date Noted   Allergic rhinitis 06/19/2023   Hordeolum externum (stye) 06/19/2023   Labor and delivery indication for care or intervention 10/29/2022   Low weight gain during pregnancy 10/29/2022   Supervision of other normal pregnancy, antepartum 03/11/2022   Alternating constipation and diarrhea 08/30/2020   Dysuria 08/30/2020   Vitamin D  deficiency 08/11/2020   Fatigue 04/19/2018   Pelvic pain 04/19/2018   Concentration deficit 02/25/2018   Anxiety and depression 02/23/2018    Past Surgical History:  Procedure Laterality Date   NO PAST SURGERIES      OB History     Gravida  2   Para  1   Term  1   Preterm  0   AB  0   Living  1      SAB  0   IAB  0   Ectopic  0   Multiple  0   Live Births  1            Home Medications    Prior to Admission medications   Medication Sig Start Date End Date Taking? Authorizing Provider  amoxicillin -clavulanate (AUGMENTIN ) 875-125 MG tablet Take 1 tablet by mouth every 12 (twelve) hours. 01/07/24  Yes Cassandra Rafuse, DO  azelastine  (ASTELIN ) 0.1 % nasal spray Place 1 spray into both nostrils 2 (two) times daily. Use in each nostril as directed Patient not taking: Reported on 12/25/2023 12/09/23    Dineen Rollene MATSU, FNP  Biotin w/ Vitamins C & E (HAIR SKIN & NAILS GUMMIES PO) Take by mouth. Patient not taking: Reported on 12/25/2023    [provider]  cholecalciferol (VITAMIN D3) 25 MCG (1000 UNIT) tablet Take 1,000 Units by mouth daily. Patient taking differently: Take 1,000 Units by mouth daily.    [provider]  Cranberry 50 MG CHEW Chew by mouth. Patient not taking: Reported on 12/25/2023    [provider]  loratadine (CLARITIN REDITABS) 10 MG dissolvable tablet Take 10 mg by mouth daily.    [provider]  Menaquinone-7 (K2 PO) Take by mouth. Patient not taking: Reported on 12/25/2023    [provider]    Family History Family History  Problem Relation Age of Onset   Hypertension Father    Arthritis Father    Anxiety disorder Sister    Depression Sister    Depression Sister    Anxiety disorder Sister    Arthritis Paternal Grandmother    Cancer Paternal Grandmother 108       kidney   Anxiety disorder Paternal Grandmother    Irritable bowel syndrome Paternal Grandmother  Breast cancer Neg Hx    Colon cancer Neg Hx     Social History Social History   Tobacco Use   Smoking status: Never   Smokeless tobacco: Never  Vaping Use   Vaping status: Never Used  Substance Use Topics   Alcohol use: Yes    Comment: occassional   Drug use: No     Allergies   Seasonal ic [octacosanol]   Review of Systems Review of Systems: negative unless otherwise stated in HPI.      Physical Exam Triage Vital Signs ED Triage Vitals  Encounter Vitals Group     BP 01/07/24 1824 114/78     Girls Systolic BP Percentile --      Girls Diastolic BP Percentile --      Boys Systolic BP Percentile --      Boys Diastolic BP Percentile --      Pulse Rate 01/07/24 1824 79     Resp 01/07/24 1824 18     Temp 01/07/24 1824 98.8 F (37.1 C)     Temp Source 01/07/24 1824 Oral     SpO2 01/07/24 1824 99 %     Weight 01/07/24 1824 120 lb  (54.4 kg)     Height --      Head Circumference --      Peak Flow --      Pain Score 01/07/24 1823 3     Pain Loc --      Pain Education --      Exclude from Growth Chart --    No data found.  Updated Vital Signs BP 114/78 (BP Location: Right Arm)   Pulse 79   Temp 98.8 F (37.1 C) (Oral)   Resp 18   Wt 54.4 kg   LMP 11/21/2023   SpO2 99%   BMI 20.60 kg/m   Visual Acuity Right Eye Distance:   Left Eye Distance:   Bilateral Distance:    Right Eye Near:   Left Eye Near:    Bilateral Near:     Physical Exam GEN:     alert, non-toxic appearing female in no distress    HENT:  mucus membranes moist, moderate erythematous edematous turbinates, clear and yellow nasal discharge, no frontal, ethmoid or maxillary sinus tenderness  EYES:   no scleral injection or discharge NECK:  normal ROM RESP:  no increased work of breathing CVS:   regular rate  Skin:   warm and dry, no rash on visible skin    UC Treatments / Results  Labs (all labs ordered are listed, but only abnormal results are displayed) Labs Reviewed - No data to display  EKG   Radiology No results found.   Procedures Procedures (including critical care time)  Medications Ordered in UC Medications - No data to display  Initial Impression / Assessment and Plan / UC Course  I have reviewed the triage vital signs and the nursing notes.  Pertinent labs & imaging results that were available during my care of the patient were reviewed by me and considered in my medical decision making (see chart for details).       Pt is a 23 y.o. female who presents for ongoing nasal congestion and dryness.  [name] is afebrile here without recent antipyretics. Satting well on room air. Overall pt is non-toxic appearing, well hydrated, without respiratory distress. On exam, she has erythematous edematous nasal turbinates without sinus tenderness.  Discussed symptomatic treatment. Azelstaline nasal spray vs saline nasal  spray. Trial antibiotics  as unable to use prednisone due to pregnant. Given contact information for ENT as she made need a scope to assess for polyps.   Typical duration of symptoms discussed.   Return and ED precautions given and voiced understanding. Discussed MDM, treatment plan and plan for follow-up with patient who agrees with plan.     Final Clinical Impressions(s) / UC Diagnoses   Final diagnoses:  Chronic nasal congestion     Discharge Instructions      Stop by the pharmacy to pick up your prescriptions.  Follow up with your primary care provider or return to the urgent care, if not improving.       ED Prescriptions     Medication Sig Dispense Auth. Provider   amoxicillin -clavulanate (AUGMENTIN ) 875-125 MG tablet Take 1 tablet by mouth every 12 (twelve) hours. 14 tablet Rece Zechman, DO      PDMP not reviewed this encounter.   Jerah Esty, DO 01/07/24 1851

## 2024-01-07 NOTE — ED Triage Notes (Signed)
 Patient states that sx have been on and off 2-3 months Got worse over the past 2 weeks  Nasal dryness and swollen. Using neosporin.

## 2024-01-08 ENCOUNTER — Ambulatory Visit

## 2024-01-23 ENCOUNTER — Telehealth (INDEPENDENT_AMBULATORY_CARE_PROVIDER_SITE_OTHER)

## 2024-01-23 DIAGNOSIS — Z3480 Encounter for supervision of other normal pregnancy, unspecified trimester: Secondary | ICD-10-CM | POA: Insufficient documentation

## 2024-01-23 DIAGNOSIS — Z3689 Encounter for other specified antenatal screening: Secondary | ICD-10-CM

## 2024-01-23 NOTE — Progress Notes (Signed)
 New OB Intake  I connected with  Cassandra Huff on 01/23/24 at  2:15 PM EDT by MyChart Video Visit and verified that I am speaking with the correct person using two identifiers. Nurse is located at [facility] and pt is located at home.  I discussed the limitations, risks, security and privacy concerns of performing an evaluation and management service by telephone and the availability of in person appointments. I also discussed with the patient that there may be a patient responsible charge related to this service. The patient expressed understanding and agreed to proceed.  I explained I am completing New OB Intake today. We discussed her EDD of 08/27/24 that is based on LMP of 11/21/23. Pt is G2/P1001. I reviewed her allergies, medications, Medical/Surgical/OB history, and appropriate screenings. There are cats in the home: no. Based on history, this is a/an pregnancy uncomplicated . Her obstetrical history is significant for NA.  Patient Active Problem List   Diagnosis Date Noted   Supervision of other normal pregnancy, antepartum 01/23/2024   Allergic rhinitis 06/19/2023   Vitamin D  deficiency 08/11/2020   Concentration deficit 02/25/2018   Anxiety and depression 02/23/2018    Concerns addressed today:  None  Delivery Plans:  Plans to deliver at [facility].  Anatomy US  Explained first scheduled US  will be 02/05/24. Anatomy US  will be scheduled around [redacted] weeks gestational age.  Labs Discussed genetic screening with patient. Patient desires genetic testing to be drawn at new OB visit. Discussed possible labs to be drawn at new OB appointment.  COVID Vaccine Patient has not had COVID vaccine.   Social Determinants of Health Food Insecurity: denies food insecurity Transportation: Patient denies transportation needs. Childcare: Discussed no children allowed at ultrasound appointments.   First visit review I reviewed new OB appt with pt. I explained she will  have blood work and pap smear/pelvic exam if indicated. Explained pt will be seen by Eleanor Canny, CNM at first visit; encounter routed to appropriate provider.   Mathis LITTIE Getting, CMA 01/23/2024  2:36 PM

## 2024-01-23 NOTE — Patient Instructions (Signed)
 First Trimester of Pregnancy  The first trimester of pregnancy starts on the first day of your last monthly period until the end of week 13. This is months 1 through 3 of pregnancy. A week after a sperm fertilizes an egg, the egg will implant into the wall of the uterus and begin to develop into a baby. Body changes during your first trimester Your body goes through many changes during pregnancy. The changes usually return to normal after your baby is born. Physical changes Your breasts may grow larger and may hurt. The area around your nipples may get darker. Your periods will stop. Your hair and nails may grow faster. You may pee more often. Health changes You may tire easily. Your gums may bleed and may be sensitive when you brush and floss. You may not feel hungry. You may have heartburn. You may throw up or feel like you may throw up. You may want to eat some foods, but not others. You may have headaches. You may have trouble pooping (constipation). Other changes Your emotions may change from day to day. You may have more dreams. Follow these instructions at home: Medicines Talk to your health care provider if you're taking medicines. Ask if the medicines are safe to take during pregnancy. Your provider may change the medicines that you take. Do not take any medicines unless told to by your provider. Take a prenatal vitamin that has at least 600 micrograms (mcg) of folic acid. Do not use herbal medicines, illegal substances, or medicines that are not approved by your provider. Eating and drinking While you're pregnant your body needs extra food for your growing baby. Talk with your provider about what to eat while pregnant. Activity Most women are able to exercise during pregnancy. Exercises may need to change as your pregnancy goes on. Talk to your provider about your activities and exercise routines. Relieving pain and discomfort Wear a good, supportive bra if your breasts  hurt. Rest with your legs raised if you have leg cramps or low back pain. Safety Wear your seatbelt at all times when you're in a car. Talk to your provider if someone hits you, hurts you, or yells at you. Talk with your provider if you're feeling sad or have thoughts of hurting yourself. Lifestyle Certain things can be harmful while you're pregnant. Follow these rules: Do not use hot tubs, steam rooms, or saunas. Do not douche. Do not use tampons or scented pads. Do not drink alcohol,smoke, vape, or use products with nicotine or tobacco in them. If you need help quitting, talk with your provider. Avoid cat litter boxes and soil used by cats. These things carry germs that can cause harm to your pregnancy and your baby. General instructions Keep all follow-up visits. It helps you and your unborn baby stay as healthy as possible. Write down your questions. Take them to your visits. Your provider will: Talk with you about your overall health. Give you advice or refer you to specialists who can help with different needs, including: Prenatal education classes. Mental health and counseling. Foods and healthy eating. Ask for help if you need help with food. Call your dentist and ask to be seen. Brush your teeth with a soft toothbrush. Floss gently. Where to find more information American Pregnancy Association: americanpregnancy.org [employer]ians [employer] Gynecologists: acog.org Office on [employer] Health: [employer] Contact a health care provider if: You feel dizzy, faint, or have a fever. You vomit or have watery poop (diarrhea) for 2  days or more. You have abnormal discharge or bleeding from your vagina. You have pain when you pee or your pee smells bad. You have cramps, pain, or pressure in your belly area. Get help right away if: You have trouble breathing or chest pain. You have any kind of injury, such as from a fall or a car crash. These symptoms may be an  emergency. Get help right away. Call 911. Do not wait to see if the symptoms will go away. Do not drive yourself to the hospital. This information is not intended to replace advice given to you by your health care provider. Make sure you discuss any questions you have with your health care provider. Document Revised: 01/30/2023 Document Reviewed: 08/30/2022 Elsevier Patient Education  2024 Elsevier Inc.   Common Medications Safe in Pregnancy  Acne:      Constipation:  Benzoyl Peroxide     Colace  Clindamycin      Dulcolax Suppository  Topica Erythromycin     Fibercon  Salicylic Acid      Metamucil         Miralax AVOID:        Senakot   Accutane    Cough:  Retin-A       Cough Drops  Tetracycline      Phenergan w/ Codeine if Rx  Minocycline      Robitussin (Plain & DM)  Antibiotics:     Crabs/Lice:  Ceclor       RID  Cephalosporins    AVOID:  E-Mycins      Kwell  Keflex  Macrobid/Macrodantin   Diarrhea:  Penicillin      Kao-Pectate  Zithromax      Imodium AD         PUSH FLUIDS AVOID:       Cipro     Fever:  Tetracycline      Tylenol (Regular or Extra  Minocycline       Strength)  Levaquin      Extra Strength-Do not          Exceed 8 tabs/24 hrs Caffeine:        200mg /day (equiv. To 1 cup of coffee or  approx. 3 12 oz sodas)         Gas: Cold/Hayfever:       Gas-X  Benadryl      Mylicon  Claritin       Phazyme  **Claritin-D        Chlor-Trimeton    Headaches:  Dimetapp      ASA-Free Excedrin  Drixoral-Non-Drowsy     Cold Compress  Mucinex (Guaifenasin)     Tylenol (Regular or Extra  Sudafed/Sudafed-12 Hour     Strength)  **Sudafed PE Pseudoephedrine   Tylenol Cold & Sinus     Vicks Vapor Rub  Zyrtec  **AVOID if Problems With Blood Pressure         Heartburn: Avoid lying down for at least 1 hour after meals  Aciphex      Maalox     Rash:  Milk of Magnesia     Benadryl    Mylanta       1% Hydrocortisone Cream  Pepcid  Pepcid Complete   Sleep  Aids:  Prevacid      Ambien   Prilosec       Benadryl  Rolaids       Chamomile Tea  Tums (Limit 4/day)     Unisom  Tylenol PM         Warm milk-add vanilla or  Hemorrhoids:       Sugar for taste  Anusol/Anusol H.C.  (RX: Analapram 2.5%)  Sugar Substitutes:  Hydrocortisone OTC     Ok in moderation  Preparation H      Tucks        Vaseline lotion applied to tissue with wiping    Herpes:     Throat:  Acyclovir      Oragel  Famvir  Valtrex     Vaccines:         Flu Shot Leg Cramps:       *Gardasil  Benadryl      Hepatitis A         Hepatitis B Nasal Spray:       Pneumovax  Saline Nasal Spray     Polio Booster         Tetanus Nausea:       Tuberculosis test or PPD  Vitamin B6 25 mg TID   AVOID:    Dramamine      *Gardasil  Emetrol       Live Poliovirus  Ginger Root 250 mg QID    MMR (measles, mumps &  High Complex Carbs @ Bedtime    rebella)  Sea Bands-Accupressure    Varicella (Chickenpox)  Unisom 1/2 tab TID     *No known complications           If received before Pain:         Known pregnancy;   Darvocet       Resume series after  Lortab        Delivery  Percocet    Yeast:   Tramadol      Femstat  Tylenol 3      Gyne-lotrimin  Ultram       Monistat  Vicodin           MISC:         All Sunscreens           Hair Coloring/highlights          Insect Repellant's          (Including DEET)         Mystic Tans   Commonly Asked Questions During Pregnancy   Cats: A parasite can be excreted in cat feces.  To avoid exposure you need to have another person empty the little box.  If you must empty the litter box you will need to wear gloves.  Wash your hands after handling your cat.  This parasite can also be found in raw or undercooked meat so this should also be avoided.  Colds, Sore Throats, Flu: Please check your medication sheet to see what you can take for symptoms.  If your symptoms are unrelieved by these medications please call the office.  Dental Work: Most  any dental work Agricultural consultant recommends is permitted.  X-rays should only be taken during the first trimester if absolutely necessary.  Your abdomen should be shielded with a lead apron during all x-rays.  Please notify your provider prior to receiving any x-rays.  Novocaine is fine; gas is not recommended.  If your dentist requires a note from [place] prior to dental work please call the office and we will provide one for you.  Exercise: Exercise is an important part of staying healthy during your pregnancy.  You may continue most exercises you were accustomed to prior to pregnancy.  Later in your pregnancy you will most likely notice you have difficulty with activities requiring balance like riding a bicycle.  It is important that you listen to your body and avoid activities that put you at a higher risk of falling.  Adequate rest and staying well hydrated are a must!  If you have questions about the safety of specific activities ask your provider.    Exposure to Children with illness: Try to avoid obvious exposure; report any symptoms to [place] when noted,  If you have chicken pos, red measles or mumps, you should be immune to these diseases.   Please do not take any vaccines while pregnant unless you have checked with your OB provider.  Fetal Movement: After 28 weeks we recommend you do "kick counts" twice daily.  Lie or sit down in a calm quiet environment and count your baby movements "kicks".  You should feel your baby at least 10 times per hour.  If you have not felt 10 kicks within the first hour get up, walk around and have something sweet to eat or drink then repeat for an additional hour.  If count remains less than 10 per hour notify your provider.  Fumigating: Follow your pest control agent's advice as to how long to stay out of your home.  Ventilate the area well before re-entering.  Hemorrhoids:   Most over-the-counter preparations can be used during pregnancy.  Check your medication to see what is  safe to use.  It is important to use a stool softener or fiber in your diet and to drink lots of liquids.  If hemorrhoids seem to be getting worse please call the office.   Hot Tubs:  Hot tubs Jacuzzis and saunas are not recommended while pregnant.  These increase your internal body temperature and should be avoided.  Intercourse:  Sexual intercourse is safe during pregnancy as long as you are comfortable, unless otherwise advised by your provider.  Spotting may occur after intercourse; report any bright red bleeding that is heavier than spotting.  Labor:  If you know that you are in labor, please go to the hospital.  If you are unsure, please call the office and let us help you decide what to do.  Lifting, straining, etc:  If your job requires heavy lifting or straining please check with your provider for any limitations.  Generally, you should not lift items heavier than that you can lift simply with your hands and arms (no back muscles)  Painting:  Paint fumes do not harm your pregnancy, but may make you ill and should be avoided if possible.  Latex or water based paints have less odor than oils.  Use adequate ventilation while painting.  Permanents & Hair Color:  Chemicals in hair dyes are not recommended as they cause increase hair dryness which can increase hair loss during pregnancy.  " Highlighting" and permanents are allowed.  Dye may be absorbed differently and permanents may not hold as well during pregnancy.  Sunbathing:  Use a sunscreen, as skin burns easily during pregnancy.  Drink plenty of fluids; avoid over heating.  Tanning Beds:  Because their possible side effects are still unknown, tanning beds are not recommended.  Ultrasound Scans:  Routine ultrasounds are performed at approximately 20 weeks.  You will be able to see your baby's general anatomy an if you would like to know the gender this can usually be determined as well.  If it is questionable when you conceived you may  also  receive an ultrasound early in your pregnancy for dating purposes.  Otherwise ultrasound exams are not routinely performed unless there is a medical necessity.  Although you can request a scan we ask that you pay for it when conducted because insurance does not cover " patient request" scans.  Work: If your pregnancy proceeds without complications you may work until your due date, unless your physician or employer advises otherwise.  Round Ligament Pain/Pelvic Discomfort:  Sharp, shooting pains not associated with bleeding are fairly common, usually occurring in the second trimester of pregnancy.  They tend to be worse when standing up or when you remain standing for long periods of time.  These are the result of pressure of certain pelvic ligaments called "round ligaments".  Rest, Tylenol and heat seem to be the most effective relief.  As the womb and fetus grow, they rise out of the pelvis and the discomfort improves.  Please notify the office if your pain seems different than that described.  It may represent a more serious condition.

## 2024-01-26 ENCOUNTER — Encounter: Admitting: Family

## 2024-02-05 ENCOUNTER — Other Ambulatory Visit: Payer: Self-pay | Admitting: Certified Nurse Midwife

## 2024-02-05 ENCOUNTER — Ambulatory Visit

## 2024-02-05 DIAGNOSIS — Z3A10 10 weeks gestation of pregnancy: Secondary | ICD-10-CM | POA: Diagnosis not present

## 2024-02-05 DIAGNOSIS — O3680X0 Pregnancy with inconclusive fetal viability, not applicable or unspecified: Secondary | ICD-10-CM | POA: Diagnosis not present

## 2024-02-05 DIAGNOSIS — N912 Amenorrhea, unspecified: Secondary | ICD-10-CM

## 2024-02-18 ENCOUNTER — Ambulatory Visit: Admitting: Obstetrics

## 2024-02-18 ENCOUNTER — Other Ambulatory Visit (HOSPITAL_COMMUNITY)
Admission: RE | Admit: 2024-02-18 | Discharge: 2024-02-18 | Disposition: A | Discharge status: Home / Self Care | Source: Ambulatory Visit | Attending: Obstetrics | Admitting: Obstetrics

## 2024-02-18 ENCOUNTER — Encounter: Admitting: Obstetrics and Gynecology

## 2024-02-18 ENCOUNTER — Encounter: Payer: Self-pay | Admitting: Obstetrics

## 2024-02-18 VITALS — BP 97/65 | HR 87 | Wt 119.0 lb

## 2024-02-18 DIAGNOSIS — F419 Anxiety disorder, unspecified: Secondary | ICD-10-CM | POA: Insufficient documentation

## 2024-02-18 DIAGNOSIS — Z0184 Encounter for antibody response examination: Secondary | ICD-10-CM | POA: Diagnosis present

## 2024-02-18 DIAGNOSIS — Z3481 Encounter for supervision of other normal pregnancy, first trimester: Secondary | ICD-10-CM | POA: Diagnosis not present

## 2024-02-18 DIAGNOSIS — R41840 Attention and concentration deficit: Secondary | ICD-10-CM

## 2024-02-18 DIAGNOSIS — Z13220 Encounter for screening for lipoid disorders: Secondary | ICD-10-CM | POA: Diagnosis present

## 2024-02-18 DIAGNOSIS — Z1379 Encounter for other screening for genetic and chromosomal anomalies: Secondary | ICD-10-CM

## 2024-02-18 DIAGNOSIS — Z131 Encounter for screening for diabetes mellitus: Secondary | ICD-10-CM | POA: Diagnosis present

## 2024-02-18 DIAGNOSIS — F32A Depression, unspecified: Secondary | ICD-10-CM | POA: Diagnosis present

## 2024-02-18 DIAGNOSIS — D649 Anemia, unspecified: Secondary | ICD-10-CM | POA: Diagnosis present

## 2024-02-18 DIAGNOSIS — Z113 Encounter for screening for infections with a predominantly sexual mode of transmission: Secondary | ICD-10-CM | POA: Diagnosis present

## 2024-02-18 DIAGNOSIS — Z3A12 12 weeks gestation of pregnancy: Secondary | ICD-10-CM | POA: Insufficient documentation

## 2024-02-18 DIAGNOSIS — Z3480 Encounter for supervision of other normal pregnancy, unspecified trimester: Secondary | ICD-10-CM

## 2024-02-18 MED ORDER — ONDANSETRON 4 MG PO TBDP: 4.0000 mg | ORAL_TABLET | Freq: Three times a day (TID) | ORAL | 3 refills | Status: AC | PRN

## 2024-02-18 NOTE — Addendum Note (Signed)
 Addended by: TAMEA ANNALEE DEL on: 02/18/2024 11:41 AM   Modules accepted: Orders

## 2024-02-18 NOTE — Progress Notes (Signed)
 NEW OB HISTORY AND PHYSICAL  SUBJECTIVE:       Cassandra Huff is a 23 y.o. G7P1001 female, Patient's last menstrual period was 11/21/2023., Estimated Date of Delivery: 08/27/24, [redacted]w[redacted]d, presents today for establishment of Prenatal Care. She reports persistent nausea and vomiting.  She had just stopped breastfeeding her daughter when she got pregnant.  Social history Partner/Relationship: co-parenting with FOB Living situation: lives with daughter Tiney). Feels safe there Work: customer service Exercise: walking/playing with daughter Substance use: denies   Gynecologic History Patient's last menstrual period was 11/21/2023. Normal Contraception: none Last Pap: 06/22/21. Results were: normal  Obstetric History OB History  Gravida Para Term Preterm AB Living  2 1 1  0 0 1  SAB IAB Ectopic Multiple Live Births  0 0 0 0 1    # Outcome Date GA Lbr Len/2nd Weight Sex Type Anes PTL Lv  2 Current           1 Term 10/30/22 [redacted]w[redacted]d / 00:11 7 lb 4.8 oz (3.31 kg) F Vag-Spont EPI  LIV    Past Medical History:  Diagnosis Date   Anxiety    Cough 05/21/2021   Depression    Seasonal allergies    UTI (urinary tract infection)    E coli 08/16/20 tx'ed macrobid    Vitamin D  deficiency     Past Surgical History:  Procedure Laterality Date   NO PAST SURGERIES      Current Outpatient Medications on File Prior to Visit  Medication Sig Dispense Refill   amoxicillin -clavulanate (AUGMENTIN ) 875-125 MG tablet Take 1 tablet by mouth every 12 (twelve) hours. (Patient not taking: Reported on 01/23/2024) 14 tablet 0   azelastine  (ASTELIN ) 0.1 % nasal spray Place 1 spray into both nostrils 2 (two) times daily. Use in each nostril as directed (Patient not taking: Reported on 01/23/2024) 30 mL 4   Biotin w/ Vitamins C & E (HAIR SKIN & NAILS GUMMIES PO) Take by mouth. (Patient not taking: Reported on 01/23/2024)     cholecalciferol (VITAMIN D3) 25 MCG (1000 UNIT) tablet Take 1,000 Units by mouth daily.  (Patient not taking: Reported on 01/23/2024)     Cranberry 50 MG CHEW Chew by mouth. (Patient not taking: Reported on 01/23/2024)     doxylamine, Sleep, (UNISOM) 25 MG tablet Take 25 mg by mouth at bedtime as needed.     loratadine (CLARITIN REDITABS) 10 MG dissolvable tablet Take 10 mg by mouth daily.     MELATONIN PO Take by mouth.     Menaquinone-7 (K2 PO) Take by mouth. (Patient not taking: Reported on 01/23/2024)     No current facility-administered medications on file prior to visit.    Allergies  Allergen Reactions   Seasonal Ic [Octacosanol]     Social History   Socioeconomic History   Marital status: Significant Other    Spouse name: Not on file   Number of children: 0   Years of education: 14   Highest education level: Associate degree: occupational, Scientist, product/process development, or vocational program  Occupational History   Occupation: Nutritional therapist: [employer]  Tobacco Use   Smoking status: Never   Smokeless tobacco: Never  Vaping Use   Vaping status: Never Used  Substance and Sexual Activity   Alcohol use: Not Currently    Comment: occassional   Drug use: No   Sexual activity: Not Currently    Partners: Male  Other Topics Concern   Not on file  Social History Narrative   At  [employer]- senior.    AD in science   Works 2 nights and [employer]       11/2022 daughter Jonette born   Social Drivers of Health   Financial Resource Strain: Medium Risk (01/23/2024)   Overall Financial Resource Strain (CARDIA)    Difficulty of Paying Living Expenses: Somewhat hard  Food Insecurity: No Food Insecurity (01/23/2024)   Hunger Vital Sign    Worried About Running Out of Food in the Last Year: Never true    Ran Out of Food in the Last Year: Never true  Transportation Needs: No Transportation Needs (01/23/2024)   PRAPARE - Administrator, Civil Service (Medical): No    Lack of Transportation (Non-Medical): No  Physical Activity: Inactive (01/23/2024)   Exercise Vital Sign    Days  of Exercise per Week: 1 day    Minutes of Exercise per Session: 0 min  Stress: Stress Concern Present (01/23/2024)   [employer] of Occupational Health - Occupational Stress Questionnaire    Feeling of Stress: To some extent  Social Connections: Socially Isolated (01/23/2024)   Social Connection and Isolation Panel    Frequency of Communication with Friends and Family: More than three times a week    Frequency of Social Gatherings with Friends and Family: Twice a week    Attends Religious Services: Never    Database administrator or Organizations: No    Attends Engineer, structural: Not on file    Marital Status: Separated  Intimate Partner Violence: Not At Risk (01/23/2024)   Humiliation, Afraid, Rape, and Kick questionnaire    Fear of Current or Ex-Partner: No    Emotionally Abused: No    Physically Abused: No    Sexually Abused: No    Family History  Problem Relation Age of Onset   Hypertension Father    Arthritis Father    Anxiety disorder Sister    Depression Sister    Depression Sister    Anxiety disorder Sister    Arthritis Paternal Grandmother    Cancer Paternal Grandmother 37       kidney   Anxiety disorder Paternal Grandmother    Irritable bowel syndrome Paternal Grandmother    Breast cancer Neg Hx    Colon cancer Neg Hx     The following portions of the patient's history were reviewed and updated as appropriate: allergies, current medications, past OB history, past medical history, past surgical history, past family history, past social history, and problem list.  Constitutional: Denied constitutional symptoms, night sweats, recent illness, fatigue, fever, insomnia and weight loss.  Eyes: Denied eye symptoms, eye pain, photophobia, vision change and visual disturbance.  Ears/Nose/Throat/Neck: Denied ear, nose, throat or neck symptoms, hearing loss, nasal discharge, sinus congestion and sore throat.  Cardiovascular: Denied cardiovascular symptoms,  arrhythmia, chest pain/pressure, edema, exercise intolerance, orthopnea and palpitations.  Respiratory: Denied pulmonary symptoms, asthma, pleuritic pain, productive sputum, cough, dyspnea and wheezing.  Gastrointestinal: +nausea/vomiting  Genitourinary: Denied genitourinary symptoms including symptomatic vaginal discharge, pelvic relaxation issues, and urinary complaints.  Musculoskeletal: Denied musculoskeletal symptoms, stiffness, swelling, muscle weakness and myalgia.  Dermatologic: Denied dermatology symptoms, rash and scar.  Neurologic: Denied neurology symptoms, dizziness, headache, neck pain and syncope.  Psychiatric: Denied psychiatric symptoms, anxiety and depression.  Endocrine: Denied endocrine symptoms including hot flashes and night sweats.    Indications for ASA therapy (per uptodate) One of the following: Previous pregnancy with preeclampsia, especially early onset and with an adverse outcome No Multifetal gestation No  Chronic hypertension No Type 1 or 2 diabetes mellitus No Chronic kidney disease No Autoimmune disease (antiphospholipid syndrome, systemic lupus erythematosus) No  Two or more of the following: Nulliparity No Obesity (body mass index >30 kg/m2) No Family history of preeclampsia in mother or sister No Age >=35 years No Sociodemographic characteristics (African American race, low socioeconomic level) No Personal risk factors (eg, previous pregnancy with low birth weight or small for gestational age infant, previous adverse pregnancy outcome [eg, stillbirth], interval >10 years between pregnancies) No   OBJECTIVE: Initial Physical Exam (New OB)  GENERAL APPEARANCE: alert, well appearing, in no apparent distress HEAD: normocephalic, atraumatic MOUTH: mucous membranes moist, pharynx normal without lesions THYROID: no thyromegaly or masses present BREASTS: no masses noted, no significant tenderness, no palpable axillary nodes, no skin changes LUNGS: clear  to auscultation, no wheezes, rales or rhonchi, symmetric air entry HEART: regular rate and rhythm, no murmurs ABDOMEN: soft, nontender, nondistended, no abnormal masses, no epigastric pain and FHT present EXTREMITIES: no redness or tenderness in the calves or thighs SKIN: normal coloration and turgor, no rashes LYMPH NODES: no adenopathy palpable NEUROLOGIC: alert, oriented, normal speech, no focal findings or movement disorder noted  PELVIC EXAM declined  ASSESSMENT: Normal pregnancy [redacted]w[redacted]d    PLAN: Routine prenatal care. We discussed an overview of prenatal care and when to call. Reviewed diet, exercise, and weight gain recommendations in pregnancy. Discussed benefits of breastfeeding and lactation resources at [facility]. I answered all questions. Labs and genetic screening today.  See orders  Eleanor Canny, CNM

## 2024-02-19 LAB — HEMOGLOBIN A1C
Est. average glucose Bld gHb Est-mCnc: 94 mg/dL
Hgb A1c MFr Bld: 4.9 % (ref 4.8–5.6)

## 2024-02-19 LAB — URINALYSIS, ROUTINE W REFLEX MICROSCOPIC
Bilirubin, UA: NEGATIVE
Glucose, UA: NEGATIVE
Ketones, UA: NEGATIVE
Leukocytes,UA: NEGATIVE
Nitrite, UA: NEGATIVE
Protein,UA: NEGATIVE
RBC, UA: NEGATIVE
Specific Gravity, UA: 1.025 (ref 1.005–1.030)
Urobilinogen, Ur: 1 mg/dL (ref 0.2–1.0)
pH, UA: 6.5 (ref 5.0–7.5)

## 2024-02-19 LAB — CBC/D/PLT+RPR+RH+ABO+RUBIGG...
Antibody Screen: NEGATIVE
Basophils Absolute: 0 x10E3/uL (ref 0.0–0.2)
Basos: 0 %
EOS (ABSOLUTE): 0 x10E3/uL (ref 0.0–0.4)
Eos: 1 %
HCV Ab: NONREACTIVE
HIV Screen 4th Generation wRfx: NONREACTIVE
Hematocrit: 39 % (ref 34.0–46.6)
Hemoglobin: 12.8 g/dL (ref 11.1–15.9)
Hepatitis B Surface Ag: NEGATIVE
Immature Grans (Abs): 0 x10E3/uL (ref 0.0–0.1)
Immature Granulocytes: 0 %
Lymphocytes Absolute: 1.3 x10E3/uL (ref 0.7–3.1)
Lymphs: 17 %
MCH: 28.4 pg (ref 26.6–33.0)
MCHC: 32.8 g/dL (ref 31.5–35.7)
MCV: 87 fL (ref 79–97)
Monocytes Absolute: 0.3 x10E3/uL (ref 0.1–0.9)
Monocytes: 4 %
Neutrophils Absolute: 6 x10E3/uL (ref 1.4–7.0)
Neutrophils: 78 %
Platelets: 203 x10E3/uL (ref 150–450)
RBC: 4.5 x10E6/uL (ref 3.77–5.28)
RDW: 13.1 % (ref 11.7–15.4)
RPR Ser Ql: NONREACTIVE
Rh Factor: POSITIVE
Rubella Antibodies, IGG: 5.49 {index} (ref >0.99)
Varicella zoster IgG: REACTIVE
WBC: 7.7 x10E3/uL (ref 3.4–10.8)

## 2024-02-19 LAB — HCV INTERPRETATION

## 2024-02-20 LAB — CERVICOVAGINAL ANCILLARY ONLY
Bacterial Vaginitis (gardnerella): POSITIVE — AB
Candida Glabrata: NEGATIVE
Candida Vaginitis: POSITIVE — AB
Chlamydia: NEGATIVE
Comment: NEGATIVE
Comment: NEGATIVE
Comment: NEGATIVE
Comment: NEGATIVE
Comment: NEGATIVE
Comment: NORMAL
Neisseria Gonorrhea: NEGATIVE
Trichomonas: NEGATIVE

## 2024-02-20 LAB — CULTURE, OB URINE

## 2024-02-20 LAB — URINE CULTURE, OB REFLEX: Organism ID, Bacteria: NO GROWTH

## 2024-02-22 ENCOUNTER — Other Ambulatory Visit: Payer: Self-pay | Admitting: Obstetrics

## 2024-02-22 ENCOUNTER — Encounter: Payer: Self-pay | Admitting: Obstetrics

## 2024-02-22 LAB — MATERNIT 21 PLUS CORE, BLOOD
Fetal Fraction: 18
Result (T21): NEGATIVE
Trisomy 13 (Patau syndrome): NEGATIVE
Trisomy 18 (Edwards syndrome): NEGATIVE
Trisomy 21 (Down syndrome): NEGATIVE

## 2024-02-22 MED ORDER — METRONIDAZOLE 500 MG PO TABS: 500.0000 mg | ORAL_TABLET | Freq: Two times a day (BID) | ORAL | 0 refills | Status: DC

## 2024-02-22 NOTE — Progress Notes (Signed)
+  BV and yeast. Rx for metronidazole  500 mg PO BID x 7 days sent to pharmacy. Recommend Monistat 7 x 1 week. Shayann notified via MyChart.  M. Justino, CNM

## 2024-03-17 ENCOUNTER — Ambulatory Visit: Admitting: Advanced Practice Midwife

## 2024-03-17 ENCOUNTER — Encounter: Payer: Self-pay | Admitting: Advanced Practice Midwife

## 2024-03-17 VITALS — BP 112/72 | HR 81 | Wt 121.4 lb

## 2024-03-17 DIAGNOSIS — Z3689 Encounter for other specified antenatal screening: Secondary | ICD-10-CM

## 2024-03-17 DIAGNOSIS — Z3A16 16 weeks gestation of pregnancy: Secondary | ICD-10-CM

## 2024-03-17 DIAGNOSIS — Z3482 Encounter for supervision of other normal pregnancy, second trimester: Secondary | ICD-10-CM | POA: Diagnosis not present

## 2024-03-17 NOTE — Progress Notes (Signed)
 Routine Prenatal Care Visit  Subjective  Cassandra Huff is a 24 y.o. G2P1001 at [redacted]w[redacted]d being seen today for ongoing prenatal care.  She is currently monitored for the following issues for this low-risk pregnancy and has Anxiety and depression; Concentration deficit; Vitamin D  deficiency; Allergic rhinitis; and Supervision of other normal pregnancy, antepartum on their problem list.  ----------------------------------------------------------------------------------- Patient reports she is doing well. Nausea is improving.  Contractions: Not present. Vag. Bleeding: None.  Movement: Present. Leaking Fluid denies.  ----------------------------------------------------------------------------------- The following portions of the patient's history were reviewed and updated as appropriate: allergies, current medications, past family history, past medical history, past social history, past surgical history and problem list. Problem list updated.  Objective  BP 112/72   Pulse 81   Wt 121 lb 6.4 oz (55.1 kg)   LMP 11/21/2023   BMI 20.84 kg/m  Pregravid weight 120 lb (54.4 kg) Total Weight Gain 1 lb 6.4 oz (0.635 kg) Urinalysis: Urine Protein    Urine Glucose    Fetal Status: Fetal Heart Rate (bpm): 153   Movement: Present      General:  Alert, oriented and cooperative. Patient is in no acute distress.  Skin: Skin is warm and dry. No rash noted.   Cardiovascular: Normal heart rate noted  Respiratory: Normal respiratory effort, no problems with respiration noted  Abdomen: Soft, gravid, appropriate for gestational age. Pain/Pressure: Absent     Pelvic:  Cervical exam deferred        Extremities: Normal range of motion.  Edema: None  Mental Status: Normal mood and affect. Normal behavior. Normal judgment and thought content.   Assessment   23 y.o. G2P1001 at [redacted]w[redacted]d by  08/27/2024, by Last Menstrual Period presenting for routine prenatal visit  Plan   G2 Problems (from 12/25/23 to present)      Problem Noted Diagnosed Resolved         Clinical Staff Provider  Office Location  [facility] Dating  EDD by LMP c/w 10w u/s  Language  English Anatomy US     Flu Vaccine  Declined Genetic Screen  NIPS: negative, female  TDaP vaccine   Offer Hgb A1C or  GTT Early : A1C 4.9 Third trimester :   Covid Declined   LAB RESULTS   Rhogam  A/Positive/-- (10/08 1100) Blood Type A/Positive/-- (10/08 1100)   RSV Declined Antibody Negative (10/08 1100)  Feeding Plan Breast Rubella 5.49 (10/08 1100)  Contraception Unsure RPR Non Reactive (10/08 1100)   Circumcision Yes HBsAg Negative (10/08 1100)   Pediatrician  [facility] HIV Non Reactive (10/08 1100)  Support Person Braxton Varicella Reactive (10/08 1100)  Prenatal Classes No GBS  (For PCN allergy, check sensitivities)     Hep C Non Reactive (10/08 1100)   BTL Consent  Pap Diagnosis  Date Value Ref Range Status  06/22/2021   Final   - Negative for intraepithelial lesion or malignancy (NILM)    VBAC Consent NA Hgb Electro      CF      SMA           Preterm labor symptoms and general obstetric precautions including but not limited to vaginal bleeding, contractions, leaking of fluid and fetal movement were reviewed in detail with the patient. Please refer to After Visit Summary for other counseling recommendations.   Return in about 4 weeks (around 04/14/2024) for anatomy ultrasound and rob after.   Slater Rains, CNM [facility] 03/17/2024 4:20 PM

## 2024-03-17 NOTE — Patient Instructions (Signed)
 Second Trimester of Pregnancy  The second trimester of pregnancy is from week 14 through week 27. This is months 4 through 6 of pregnancy. During the second trimester: Morning sickness is less or has stopped. You may have more energy. You may feel hungry more often. At this time, your unborn baby is growing very fast. At the end of the sixth month, the unborn baby may be up to 12 inches long and weigh about 1 pounds. You will likely start to feel the baby move between 16 and 20 weeks of pregnancy. Body changes during your second trimester Your body continues to change during this time. The changes usually go away after your baby is born. Physical changes You will gain more weight. Your belly will get bigger. You may begin to get stretch marks on your hips, belly, and breasts. Your breasts will keep growing and may hurt. You may get dark spots or blotches on your face. A dark line from your belly button to the pubic area may appear. This line is called linea nigra. Your hair may grow faster and get thicker. Health changes You may have headaches. You may have heartburn. You may pee more often. You may have swollen, bulging veins (varicose veins). You may have trouble pooping (constipation), or swollen veins in the butt that can itch or get painful (hemorrhoids). You may have back pain. This is caused by: Weight gain. Pregnancy hormones that are relaxing the joints in your pelvis. Follow these instructions at home: Medicines Talk to your health care provider if you're taking medicines. Ask if the medicines are safe to take during pregnancy. Your provider may change the medicines that you take. Do not take any medicines unless told to by your provider. Take a prenatal vitamin that has at least 600 micrograms (mcg) of folic acid. Do not use herbal medicines, illegal drugs, or medicines that are not approved by your provider. Eating and drinking While you're pregnant your body needs  extra food for your growing baby. Talk with your provider about what to eat while pregnant. Activity Most women are able to exercise during pregnancy. Exercises may need to change as your pregnancy goes on. Talk to your provider about your activities and exercise routines. Relieving pain and discomfort Wear a good, supportive bra if your breasts hurt. Rest with your legs raised if you have leg cramps or low back pain. Take warm sitz baths to soothe pain from hemorrhoids. Use hemorrhoid cream if your provider says it's okay. Do not douche. Do not use tampons or scented pads. Do not use hot tubs, steam rooms, or saunas. Safety Wear your seatbelt at all times when you're in a car. Talk to your provider if someone hits you, hurts you, or yells at you. Talk with your provider if you're feeling sad or have thoughts of hurting yourself. Lifestyle Certain things can be harmful while you're pregnant. It's best to avoid the following: Do not drink alcohol,smoke, vape, or use products with nicotine or tobacco in them. If you need help quitting, talk with your provider. Avoid cat litter boxes and soil used by cats. These things carry germs that can cause harm to your pregnancy and your baby. General instructions Keep all follow-up visits. It helps you and your unborn baby stay as healthy as possible. Write down your questions. Take them to your prenatal visits. Your provider will: Talk with you about your overall health. Give you advice or refer you to specialists who can help with different needs,  including: Prenatal education classes. Mental health and counseling. Foods and healthy eating. Ask for help if you need help with food. Where to find more information American Pregnancy Association: americanpregnancy.org [employer]: acog.org Office on [employer] Health: [employer] Contact a health care provider if: You have a headache that does not go away  when you take medicine. You have any of these problems: You can't eat or drink. You throw up or feel like you may throw up. You have watery poop (diarrhea) for 2 days or more. You have pain when you pee or your pee smells bad. You have been sick for 2 days or more and are not getting better. Contact your provider right away if: You have any of these coming from your vagina: Abnormal discharge. Bad-smelling fluid. Bleeding. Your baby is moving less than usual. You have contractions, belly cramping, or have pain in your pelvis or lower back. You have symptoms of high blood pressure or preeclampsia. These include: A severe, throbbing headache that does not go away. Sudden or extreme swelling of your face, hands, legs, or feet. Vision problems: You see spots. You have blurry vision. Your eyes are sensitive to light. If you can't reach the provider, go to an urgent care or emergency room. Get help right away if: You faint, become confused, or can't think clearly. You have chest pain or trouble breathing. You have any kind of injury, such as from a fall or a car crash. These symptoms may be an emergency. Call 911 right away. Do not wait to see if the symptoms will go away. Do not drive yourself to the hospital. This information is not intended to replace advice given to you by your health care provider. Make sure you discuss any questions you have with your health care provider. Document Revised: 01/30/2023 Document Reviewed: 08/30/2022 Elsevier Patient Education  2024 [employer].

## 2024-03-22 ENCOUNTER — Other Ambulatory Visit: Payer: Self-pay

## 2024-03-22 MED ORDER — METRONIDAZOLE 0.75 % VA GEL: 1.0000 | Freq: Two times a day (BID) | VAGINAL | 0 refills | Status: DC

## 2024-04-01 ENCOUNTER — Telehealth: Payer: Self-pay

## 2024-04-01 NOTE — Telephone Encounter (Signed)
 Patient is [redacted]w[redacted]d and called to report a persistent headache over the last couple of days along with feeling dizzy and shaky.  She does not have a history of chronic or gestational hypertension.  She denies blurry/double vision or RUQ pain.  She says she took a Tylenol  yesterday and it helped a little, but she hasn't taken any today.  She says she sometimes gets headaches like this when she needs some caffeine.  Counseled to increase her fluid intake and if possible check her blood pressure.  She says her dad lives next door and has a cuff she can use.  She will call back if BP over 140/90.  Encouraged her to take Tylenol  as needed, and if that doesn't work, to add in a benadryl  and a can of coke to see if that helps.  Patient verbalized understanding and agreement.

## 2024-04-14 ENCOUNTER — Ambulatory Visit: Admitting: Licensed Practical Nurse

## 2024-04-14 ENCOUNTER — Ambulatory Visit

## 2024-04-14 ENCOUNTER — Encounter: Payer: Self-pay | Admitting: Licensed Practical Nurse

## 2024-04-14 VITALS — BP 110/74 | HR 75 | Wt 123.2 lb

## 2024-04-14 DIAGNOSIS — Z3482 Encounter for supervision of other normal pregnancy, second trimester: Secondary | ICD-10-CM

## 2024-04-14 DIAGNOSIS — F419 Anxiety disorder, unspecified: Secondary | ICD-10-CM

## 2024-04-14 DIAGNOSIS — F418 Other specified anxiety disorders: Secondary | ICD-10-CM | POA: Diagnosis not present

## 2024-04-14 DIAGNOSIS — Z3A20 20 weeks gestation of pregnancy: Secondary | ICD-10-CM

## 2024-04-14 DIAGNOSIS — Z3480 Encounter for supervision of other normal pregnancy, unspecified trimester: Secondary | ICD-10-CM

## 2024-04-14 DIAGNOSIS — O99342 Other mental disorders complicating pregnancy, second trimester: Secondary | ICD-10-CM

## 2024-04-14 NOTE — Assessment & Plan Note (Signed)
-   denies concerns today

## 2024-04-14 NOTE — Progress Notes (Signed)
    Return Prenatal Note   Subjective   23 y.o. G2P1001 at [redacted]w[redacted]d presents for this follow-up prenatal visit.  Patient  Patient reports: doing well, no concerns,  -has some cramping and sciatic pain  -Mood has been back and forth it depends -Was not aware of US  time, needed to reschedule  -Headaches, needs to increase water, gets HA's at end of day, daughter wakes once a night so sleep is broken.     Movement: Present Contractions: Not present  Objective   Flow sheet Vitals: Pulse Rate: 75 BP: 110/74 Fetal Heart Rate (bpm): 150 Total weight gain: 3 lb 3.2 oz (1.452 kg)  General Appearance  No acute distress, well appearing, and well nourished Pulmonary   Normal work of breathing Neurologic   Alert and oriented to person, place, and time Psychiatric   Mood and affect within normal limits   Assessment/Plan   Plan  23 y.o. G2P1001 at [redacted]w[redacted]d presents for follow-up OB visit. Reviewed prenatal record including previous visit note.  Anxiety and depression -denies concerns today   Supervision of other normal pregnancy, antepartum -TWG 3lbs, did not gain a lot in previous pregnavcy, admits she does not eat a lot in general has been craving sweets and appetite has increased recently -Discussed Magnesium, Co Q 10 and Riboflavin for HA -Anatomy US  rescheduled  -Declined AFP and flu  Warning signs reviewed       Orders Placed This Encounter  Procedures   US  OB Comp + 14 Wk    Standing Status:   Future    Expected Date:   04/15/2024    Expiration Date:   04/14/2025    Reason for Exam (SYMPTOM  OR DIAGNOSIS REQUIRED):   Anatomy Scan    Preferred Imaging Location?:   External    Release to patient:   Immediate   No follow-ups on file.   Future Appointments  Date Time Provider Department Center  04/20/2024  2:30 PM [facility]  3 [facility]  [facility]  05/12/2024  2:55 PM Lynda Bradley, CNM [facility] None    For next visit:  continue with routine prenatal care     JINNIE HERO  [facility], CNM  12/03/253:16 PM

## 2024-04-14 NOTE — Assessment & Plan Note (Addendum)
-  TWG 3lbs, did not gain a lot in previous pregnavcy, admits she does not eat a lot in general has been craving sweets and appetite has increased recently -Discussed Magnesium, Co Q 10 and Riboflavin for HA -Anatomy US  rescheduled  -Declined AFP and flu  Warning signs reviewed

## 2024-04-14 NOTE — Patient Instructions (Signed)
  Headache Prevention: Magnesium 500 mg daily B2 400 mg daily Coenzyme q10 150 mg daily

## 2024-04-14 NOTE — Progress Notes (Signed)
 [name]

## 2024-04-20 ENCOUNTER — Ambulatory Visit
Admission: RE | Admit: 2024-04-20 | Discharge: 2024-04-20 | Attending: Licensed Practical Nurse | Admitting: Licensed Practical Nurse

## 2024-04-20 DIAGNOSIS — Z3689 Encounter for other specified antenatal screening: Secondary | ICD-10-CM | POA: Diagnosis not present

## 2024-04-20 DIAGNOSIS — Z3482 Encounter for supervision of other normal pregnancy, second trimester: Secondary | ICD-10-CM | POA: Insufficient documentation

## 2024-04-20 DIAGNOSIS — Z363 Encounter for antenatal screening for malformations: Secondary | ICD-10-CM | POA: Insufficient documentation

## 2024-04-20 DIAGNOSIS — Z3A20 20 weeks gestation of pregnancy: Secondary | ICD-10-CM

## 2024-04-20 DIAGNOSIS — Z3A21 21 weeks gestation of pregnancy: Secondary | ICD-10-CM | POA: Diagnosis not present

## 2024-05-03 ENCOUNTER — Encounter: Payer: Self-pay | Admitting: Family

## 2024-05-11 NOTE — Telephone Encounter (Signed)
 Noted

## 2024-05-12 ENCOUNTER — Encounter: Admitting: Advanced Practice Midwife

## 2024-05-25 ENCOUNTER — Encounter: Payer: Self-pay | Admitting: Advanced Practice Midwife

## 2024-05-25 ENCOUNTER — Ambulatory Visit (INDEPENDENT_AMBULATORY_CARE_PROVIDER_SITE_OTHER): Admitting: Advanced Practice Midwife

## 2024-05-25 VITALS — BP 108/67 | HR 88 | Wt 126.7 lb

## 2024-05-25 DIAGNOSIS — Z3A26 26 weeks gestation of pregnancy: Secondary | ICD-10-CM | POA: Diagnosis not present

## 2024-05-25 DIAGNOSIS — Z131 Encounter for screening for diabetes mellitus: Secondary | ICD-10-CM

## 2024-05-25 DIAGNOSIS — Z130 Encounter for screening for diseases of the blood and blood-forming organs and certain disorders involving the immune mechanism: Secondary | ICD-10-CM

## 2024-05-25 DIAGNOSIS — Z3482 Encounter for supervision of other normal pregnancy, second trimester: Secondary | ICD-10-CM

## 2024-05-25 DIAGNOSIS — Z113 Encounter for screening for infections with a predominantly sexual mode of transmission: Secondary | ICD-10-CM

## 2024-05-25 NOTE — Patient Instructions (Signed)
 Oral Glucose Tolerance Test During Pregnancy Why am I having this test? The oral glucose tolerance test (OGTT) is done to check how your body uses blood sugar, also called glucose. It's one of many tests used to diagnose the type of diabetes you can get while pregnant. This type of diabetes is called gestational diabetes mellitus (GDM). You may get GDM during the middle part of your pregnancy. In most cases, it goes away after you give birth. Most people get tested for GDM around weeks 24-28 of pregnancy. You may have the test sooner if: You or your mother had diabetes while pregnant. A person in your family has diabetes. You're having more than one baby this pregnancy. You've had a baby before who weighed more than 9 pounds (4 kg) at birth. You have high blood pressure or heart disease. You have a large body. You're not active. What is being tested? This test measures your blood sugar at different times. It shows how well your body uses the sugar in your blood. What kind of sample is taken?  A sample of blood is needed for this test. The sample is taken by putting a needle into a blood vessel. How do I prepare for this test? Eat your normal meals the day before the test. Your health care provider will tell you about: Eating or drinking on the day of the test. You may need to fast for 8-10 hours before the test. When you fast, you can only have water. Changing or stopping your regular medicines. Some medicines may affect your test results. Tell a health care provider about: All medicines you take. These include vitamins, herbs, eye drops, and creams. What happens during the test? The test involves these steps: Your blood sugar will be checked. It's called your fasting blood sugar if you fasted before the test. You'll drink a sugary mixture. Your blood sugar will be checked again. For a 1-hour test, it will be checked after an hour. For a 3-hour test, it will be checked 1, 2, and 3 hours  after you drink the sugary mixture. The test takes 1-3 hours. You'll need to stay at the testing place during this time. During the testing time: Do not eat or drink anything after the sugary drink. Do not exercise. Do not use any products that contain nicotine  or tobacco. These products include cigarettes, chewing tobacco, and vaping devices, such as e-cigarettes. The test may vary among providers and hospitals. How are the results reported? Your provider will compare your results to normal values for the kind of test that you had done. You may need to call or meet with your provider to get your results. What do the results mean? Your provider can tell you what blood sugar levels are normal for the test you're doing. If two or more of your blood sugar levels are at or above normal, you may be diagnosed with GDM. If only one level is high, your provider may suggest: Doing the test again. Doing other tests to confirm a diagnosis. Talk with your provider about what your results mean. Questions to ask your health care provider Ask your provider, or the department doing the test: When will my results be ready? How will I get my results? What are my next steps? This information is not intended to replace advice given to you by your health care provider. Make sure you discuss any questions you have with your health care provider. Document Revised: 09/02/2022 Document Reviewed: 09/02/2022 Elsevier Patient Education  2024 Elsevier Inc.

## 2024-05-25 NOTE — Progress Notes (Signed)
 "   Routine Prenatal Care Visit  Subjective  Cassandra Huff is a 24 y.o. G2P1001 at [redacted]w[redacted]d being seen today for ongoing prenatal care.  She is currently monitored for the following issues for this low-risk pregnancy and has Anxiety and depression; Concentration deficit; Vitamin D  deficiency; Allergic rhinitis; and Supervision of other normal pregnancy, antepartum on their problem list.  ----------------------------------------------------------------------------------- Patient reports she is tired today due to not sleeping well last night. Otherwise she is doing well. Reports having a good appetite and she weighs more with this pregnancy than her first. Says she has vaginal odor after intercourse. No current s/s of vaginitis. Suggested apple cider vinegar soaks if symptoms develop. Recommend against use of Boric acid.    Contractions: Not present. Vag. Bleeding: None.  Movement: Present. Leaking Fluid denies.  ----------------------------------------------------------------------------------- The following portions of the patient's history were reviewed and updated as appropriate: allergies, current medications, past family history, past medical history, past social history, past surgical history and problem list. Problem list updated.  Objective  BP 108/67   Pulse 88   Wt 126 lb 11.2 oz (57.5 kg)   LMP 11/21/2023   BMI 21.75 kg/m  Pregravid weight 120 lb (54.4 kg) Total Weight Gain 6 lb 11.2 oz (3.039 kg) Urinalysis: Urine Protein    Urine Glucose    Fetal Status: Fetal Heart Rate (bpm): 145 Fundal Height: 26 cm Movement: Present      General:  Alert, oriented and cooperative. Patient is in no acute distress.  Skin: Skin is warm and dry. No rash noted.   Cardiovascular: Normal heart rate noted  Respiratory: Normal respiratory effort, no problems with respiration noted  Abdomen: Soft, gravid, appropriate for gestational age. Pain/Pressure: Absent     Pelvic:  Cervical exam deferred         Extremities: Normal range of motion.  Edema: None  Mental Status: Normal mood and affect. Normal behavior. Normal judgment and thought content.   Assessment   24 y.o. G2P1001 at [redacted]w[redacted]d by  08/27/2024, by Last Menstrual Period presenting for routine prenatal visit  Plan   G2 Problems (from 12/25/23 to present)     Problem Noted Diagnosed Resolved   Supervision of other normal pregnancy, antepartum 01/23/2024 by Millicent Mathis CROME, CMA  No   Overview Addendum 05/25/2024  3:02 PM by Taft Salines, LPN   Clinical Staff Provider  Office Location  [facility] Dating  EDD by LMP c/w 10w u/s  Language  English Anatomy US   Complete; Anterior placenta  Flu Vaccine  Declined Genetic Screen  NIPS: negative, female  TDaP vaccine   Offer Hgb A1C or  GTT Early : A1C 4.9 Third trimester :   Covid Declined   LAB RESULTS   Rhogam  A/Positive/-- (10/08 1100) Blood Type A/Positive/-- (10/08 1100)   RSV Declined Antibody Negative (10/08 1100)  Feeding Plan Breast Rubella 5.49 (10/08 1100)  Contraception Unsure 05/25/24 RPR Non Reactive (10/08 1100)   Circumcision Yes HBsAg Negative (10/08 1100)   Pediatrician  [facility] HIV Non Reactive (10/08 1100)  Support Person Braxton Varicella Reactive (10/08 1100)  Prenatal Classes No GBS  (For PCN allergy, check sensitivities)     Hep C Non Reactive (10/08 1100)   BTL Consent  Pap Diagnosis  Date Value Ref Range Status  06/22/2021   Final   - Negative for intraepithelial lesion or malignancy (NILM)    VBAC Consent NA Hgb Electro      CF  SMA                    Preterm labor symptoms and general obstetric precautions including but not limited to vaginal bleeding, contractions, leaking of fluid and fetal movement were reviewed in detail with the patient. Please refer to After Visit Summary for other counseling recommendations.   Return in about 2 weeks (around 06/08/2024) for 28 wk labs/ROB.  Slater Rains, CNM 05/25/2024 3:44 PM    "

## 2024-06-08 ENCOUNTER — Encounter: Admitting: Advanced Practice Midwife

## 2024-06-08 ENCOUNTER — Other Ambulatory Visit

## 2024-06-16 ENCOUNTER — Other Ambulatory Visit

## 2024-06-16 ENCOUNTER — Ambulatory Visit (INDEPENDENT_AMBULATORY_CARE_PROVIDER_SITE_OTHER): Admitting: Obstetrics

## 2024-06-16 VITALS — BP 103/62 | HR 97 | Wt 129.4 lb

## 2024-06-16 DIAGNOSIS — Z3480 Encounter for supervision of other normal pregnancy, unspecified trimester: Secondary | ICD-10-CM | POA: Diagnosis not present

## 2024-06-16 NOTE — Patient Instructions (Signed)
 Third Trimester of Pregnancy: What to Know  The third trimester of pregnancy is from week 28 through week 40. This is months 7 through 9. The third trimester is a time when your baby is growing fast. Body changes during your third trimester Your body continues to change during this time. The changes usually go away after your baby is born. Physical changes You will continue to gain weight. You Locatelli get stretch marks on your hips, belly, and breasts. Your breasts will keep growing and Budde hurt. A yellow fluid (colostrum) Nale leak from your breasts. This is the first milk you're making for your baby. Your hair Mailhot grow faster and get thicker. In some cases, you Muraski get hair loss. Your belly button Messer stick out. You Wrubel have more swelling in your hands, face, or ankles. Health changes You Tuccillo have heartburn. You Gangi feel short of breath. This is caused by the uterus that is now bigger. You Decoursey have more aches in the pelvis, back, or thighs. You Mcglinchey have more tingling or numbness in your hands, arms, and legs. You Ken pee more often. You Campau have trouble pooping (constipation) or swollen veins in the butt that can itch or get painful (hemorrhoids). Other changes You Mui have more problems sleeping. You Gohman notice the baby moving lower in your belly (dropping). You Kirker have more fluid coming from your vagina. Your joints Duclos feel loose, and you Stum have pain around your pelvic bone. Follow these instructions at home: Medicines Take medicines only as told by your health care provider. Some medicines are not safe during pregnancy. Your provider Amrhein change the medicines that you take. Do not take any medicines unless told to by your provider. Take a prenatal vitamin that has at least 600 micrograms (mcg) of folic acid. Do not use herbal medicines, illegal drugs, or medicines that are not approved by your provider. Eating and drinking While you're pregnant your body needs additional nutrition  to help support your growing baby. Talk with your provider about your nutritional needs. Activity Most women are able to exercise regularly during pregnancy. Exercise routines Grauberger need to change at the end of your pregnancy. Talk to your provider about your activities and exercise routine. Relieving pain and discomfort Rest often with your legs raised if you have leg cramps or low back pain. Take warm sitz baths to soothe pain from hemorrhoids. Use hemorrhoid cream if your provider says it's okay. Wear a good, supportive bra if your breasts hurt. Do not use hot tubs, steam rooms, or saunas. Do not douche. Do not use tampons or scented pads. Safety Talk to your provider before traveling far distances. Wear your seatbelt at all times when you're in a car. Talk to your provider if someone hits you, hurts you, or yells at you. Preparing for birth To prepare for your baby: Take childbirth and breastfeeding classes. Visit the hospital and tour the maternity area. Buy a rear-facing car seat. Learn how to install it in your car. General instructions Avoid cat litter boxes and soil used by cats. These things carry germs that can cause harm to your pregnancy and your baby. Do not drink alcohol, smoke, vape, or use products with nicotine  or tobacco in them. If you need help quitting, talk with your provider. Keep all follow-up visits for your third trimester. Your provider will do more exams and tests during this trimester. Write down your questions. Take them to your prenatal visits. Your provider also will: Talk  with you about your overall health. Give you advice or refer you to specialists who can help with different needs, including: Mental health and counseling. Foods and healthy eating. Ask for help if you need help with food. Where to find more information American Pregnancy Association: americanpregnancy.org [employer]: acog.org Office on [employer]  Health: [place] Contact a health care provider if: You have a headache that does not go away when you take medicine. You have any of these problems: You can't eat or drink. You have nausea and vomiting. You have watery poop (diarrhea) for 2 days or more. You have pain when you pee, or your pee smells bad. You have been sick for 2 days or more and aren't getting better. Contact your provider right away if: You have any of these coming from your vagina: Abnormal discharge. Bad-smelling fluid. Bleeding. Your baby is moving less than usual. You have signs of labor: You have any contractions, belly cramping, or have pain in your pelvis or lower back before 37 weeks of pregnancy (preterm labor). You have regular contractions that are less than 5 minutes apart. Your water breaks. You have symptoms of high blood pressure or preeclampsia. These include: A severe, throbbing headache that does not go away. Sudden or extreme swelling of your face, hands, legs, or feet. Vision problems: You see spots. You have blurry vision. Your eyes are sensitive to light. If you can't reach your provider, go to an urgent care or emergency room. Get help right away if: You faint, become confused, or can't think clearly. You have chest pain or trouble breathing. You have any kind of injury, such as from a fall or a car crash. These symptoms Matzen be an emergency. Call 911 right away. Do not wait to see if the symptoms will go away. Do not drive yourself to the hospital. This information is not intended to replace advice given to you by your health care provider. Make sure you discuss any questions you have with your health care provider. Document Revised: 03/11/2024 Document Reviewed: 08/30/2022 Elsevier Patient Education  2025 [employer].

## 2024-06-16 NOTE — Progress Notes (Signed)
" ° ° °  Return Prenatal Note   Subjective  24 y.o. G2P1001 at [redacted]w[redacted]d presents for this follow-up prenatal visit.   Patient was unable to complete one hour glucose testing today as she vomited when drinking the Glucola.  She would like to defer Tdap to next visit.  Blood transfusion consent signed.   Patient reports: Movement: Present Contractions: Not present Denies vaginal bleeding or leaking fluid. Objective  Flow sheet Vitals: Pulse Rate: 97 BP: 103/62 Fundal Height: 30 cm Fetal Heart Rate (bpm): 139 Total weight gain: 9 lb 6.4 oz (4.264 kg)  General Appearance  No acute distress, well appearing, and well nourished Pulmonary   Normal work of breathing Neurologic   Alert and oriented to person, place, and time Psychiatric   Mood and affect within normal limits   Assessment/Plan   Plan  24 y.o. G2P1001 at [redacted]w[redacted]d presents for follow-up OB visit. Reviewed prenatal record including previous visit note.  1. Supervision of other normal pregnancy, antepartum (Primary) -Return in the next few days to complete 1hGTT and 28wk labs -- discussed eating prior to glucola, needs to be protein, not carb-heavy, otherwise will falsely elevate her result -Due to feeling nauseated, would like to defer Tdap to next visit -Discussed her L sciatica and reviewed stretches she can try to alleviate  Return in about 2 weeks (around 06/30/2024) for ROB.   Future Appointments  Date Time Provider Department Center  06/22/2024  9:20 AM [facility] None  06/30/2024  3:15 PM Slaughterbeck, Damien, CNM [facility] None    For next visit:  continue with routine prenatal care   Estil Mangle, DO [facility] [place] "

## 2024-06-22 ENCOUNTER — Other Ambulatory Visit

## 2024-06-30 ENCOUNTER — Encounter: Admitting: Certified Nurse Midwife
